# Patient Record
Sex: Female | Born: 1935
Health system: Southern US, Community
[De-identification: ages and names within clinical notes are randomized; demographics above are authoritative.]

## PROBLEM LIST (undated history)

## (undated) DIAGNOSIS — E785 Hyperlipidemia, unspecified: Secondary | ICD-10-CM

## (undated) DIAGNOSIS — I10 Essential (primary) hypertension: Secondary | ICD-10-CM

## (undated) DIAGNOSIS — I219 Acute myocardial infarction, unspecified: Secondary | ICD-10-CM

## (undated) HISTORY — DX: Hyperlipidemia, unspecified: E78.5

## (undated) HISTORY — DX: Acute myocardial infarction, unspecified: I21.9

## (undated) HISTORY — DX: Essential (primary) hypertension: I10

---

## 2008-04-04 ENCOUNTER — Emergency Department (HOSPITAL_COMMUNITY): Admission: EM | Admit: 2008-04-04 | Discharge: 2008-04-05 | Payer: Self-pay | Admitting: Emergency Medicine

## 2011-08-08 LAB — DIFFERENTIAL
Basophils Relative: 0
Lymphs Abs: 2.7
Monocytes Absolute: 0.6
Monocytes Relative: 6
Neutro Abs: 5.9

## 2011-08-08 LAB — POCT I-STAT, CHEM 8
BUN: 30 — ABNORMAL HIGH
Chloride: 108
Creatinine, Ser: 1.5 — ABNORMAL HIGH
Potassium: 3.8
Sodium: 138
TCO2: 21

## 2011-08-08 LAB — CBC
Hemoglobin: 13.6
MCHC: 34.7
MCV: 81
RBC: 4.86
WBC: 9.6

## 2011-08-08 LAB — POCT CARDIAC MARKERS
CKMB, poc: <1 — ABNORMAL LOW
Myoglobin, poc: 47.8
Operator id: 277751
Troponin i, poc: <0.05

## 2013-12-23 ENCOUNTER — Ambulatory Visit: Payer: Medicare Other

## 2013-12-23 ENCOUNTER — Ambulatory Visit (INDEPENDENT_AMBULATORY_CARE_PROVIDER_SITE_OTHER): Payer: Medicare Other | Admitting: Emergency Medicine

## 2013-12-23 VITALS — BP 102/60 | HR 68 | Temp 98.2°F | Resp 16 | Ht 63.0 in | Wt 173.4 lb

## 2013-12-23 DIAGNOSIS — M25519 Pain in unspecified shoulder: Secondary | ICD-10-CM | POA: Diagnosis not present

## 2013-12-23 MED ORDER — DICLOFENAC SODIUM 1 % TD GEL: 2.0000 g | Freq: Four times a day (QID) | TRANSDERMAL | Status: DC

## 2013-12-23 NOTE — Patient Instructions (Addendum)
     You have an appointment at the [address] to see Dr. Everlene Farrier at 8:00 in the morning on Monday, February 23  Shoulder Pain The shoulder is the joint that connects your arms to your body. The bones that form the shoulder joint include the upper arm bone (humerus), the shoulder blade (scapula), and the collarbone (clavicle). The top of the humerus is shaped like a ball and fits into a rather flat socket on the scapula (glenoid cavity). A combination of muscles and strong, fibrous tissues that connect muscles to bones (tendons) support your shoulder joint and hold the ball in the socket. Small, fluid-filled sacs (bursae) are located in different areas of the joint. They act as cushions between the bones and the overlying soft tissues and help reduce friction between the gliding tendons and the bone as you move your arm. Your shoulder joint allows a wide range of motion in your arm. This range of motion allows you to do things like scratch your back or throw a ball. However, this range of motion also makes your shoulder more prone to pain from overuse and injury. Causes of shoulder pain can originate from both injury and overuse and usually can be grouped in the following four categories:  Redness, swelling, and pain (inflammation) of the tendon (tendinitis) or the bursae (bursitis).  Instability, such as a dislocation of the joint.  Inflammation of the joint (arthritis).  Broken bone (fracture). HOME CARE INSTRUCTIONS   Apply ice to the sore area.  Put ice in a plastic bag.  Place a towel between your skin and the bag.  Leave the ice on for 15-20 minutes, 03-04 times per day for the first 2 days.  Stop using cold packs if they do not help with the pain.  If you have a shoulder sling or immobilizer, wear it as long as your caregiver instructs. Only remove it to shower or bathe. Move your arm as little as possible, but keep your hand moving to prevent swelling.  Squeeze a soft ball or foam  pad as much as possible to help prevent swelling.  Only take over-the-counter or prescription medicines for pain, discomfort, or fever as directed by your caregiver. SEEK MEDICAL CARE IF:   Your shoulder pain increases, or new pain develops in your arm, hand, or fingers.  Your hand or fingers become cold and numb.  Your pain is not relieved with medicines. SEEK IMMEDIATE MEDICAL CARE IF:   Your arm, hand, or fingers are numb or tingling.  Your arm, hand, or fingers are significantly swollen or turn white or blue. MAKE SURE YOU:   Understand these instructions.  Will watch your condition.  Will get help right away if you are not doing well or get worse. Document Released: 08/08/2005 Document Revised: 07/23/2012 Document Reviewed: 10/13/2011 [facility] Patient Information 2014 [employer].

## 2013-12-23 NOTE — Progress Notes (Signed)
   Subjective:    Patient ID: Ariel Kelly, female    DOB: 09-Sep-1936, 78 y.o.   MRN: [id number]  HPI patient is with severe discomfort in her right shoulder and inability to raise her right shoulder. She did help her daughter to a large amount of cooking recently and then developed the pain. She has been hurting in her right shoulder for at least a week. It is very difficult for her to raise her right arm    Review of Systems     Objective:   Physical Exam Tender over the right subdeltoid area. There is crepitation with abduction of the right arm. There is limited internal and external rotation to  [facility] reading (PRIMARY) by  Dr.  Everlene Farrier no fractures         Assessment & Plan:    Will treat with topical Voltaren and have patient schedule herself for a more complete physical examination

## 2013-12-24 ENCOUNTER — Ambulatory Visit: Payer: Medicare Other

## 2013-12-24 ENCOUNTER — Encounter: Payer: Self-pay | Admitting: Emergency Medicine

## 2013-12-24 ENCOUNTER — Ambulatory Visit (INDEPENDENT_AMBULATORY_CARE_PROVIDER_SITE_OTHER): Payer: Medicare Other | Admitting: Emergency Medicine

## 2013-12-24 ENCOUNTER — Other Ambulatory Visit: Payer: Self-pay | Admitting: Emergency Medicine

## 2013-12-24 VITALS — BP 130/68 | HR 69 | Temp 98.6°F | Resp 16 | Ht 63.5 in | Wt 172.2 lb

## 2013-12-24 DIAGNOSIS — Z720 Tobacco use: Secondary | ICD-10-CM

## 2013-12-24 DIAGNOSIS — Z1211 Encounter for screening for malignant neoplasm of colon: Secondary | ICD-10-CM | POA: Diagnosis not present

## 2013-12-24 DIAGNOSIS — F17200 Nicotine dependence, unspecified, uncomplicated: Secondary | ICD-10-CM

## 2013-12-24 DIAGNOSIS — R112 Nausea with vomiting, unspecified: Secondary | ICD-10-CM | POA: Diagnosis not present

## 2013-12-24 DIAGNOSIS — H8110 Benign paroxysmal vertigo, unspecified ear: Secondary | ICD-10-CM

## 2013-12-24 DIAGNOSIS — Z0000 Encounter for general adult medical examination without abnormal findings: Secondary | ICD-10-CM

## 2013-12-24 DIAGNOSIS — R1013 Epigastric pain: Secondary | ICD-10-CM | POA: Diagnosis not present

## 2013-12-24 DIAGNOSIS — I10 Essential (primary) hypertension: Secondary | ICD-10-CM | POA: Diagnosis not present

## 2013-12-24 DIAGNOSIS — D2339 Other benign neoplasm of skin of other parts of face: Secondary | ICD-10-CM

## 2013-12-24 LAB — POCT CBC
Granulocyte percent: 65.2 %G (ref 37–80)
HCT, POC: 40.4 % (ref 37.7–47.9)
Hemoglobin: 12.4 g/dL (ref 12.2–16.2)
Lymph, poc: 2.1 (ref 0.6–3.4)
MCH: 26.7 pg — AB (ref 27–31.2)
MCHC: 30.7 g/dL — AB (ref 31.8–35.4)
MCV: 87.1 fL (ref 80–97)
MID (CBC): 0.5 (ref 0–0.9)
MPV: 12.4 fL (ref 0–99.8)
PLATELET COUNT, POC: 157 10*3/uL (ref 142–424)
POC Granulocyte: 4.8 (ref 2–6.9)
POC LYMPH PERCENT: 28.7 %L (ref 10–50)
POC MID %: 6.1 %M (ref 0–12)
RBC: 4.64 M/uL (ref 4.04–5.48)
RDW, POC: 13.9 %
WBC: 7.4 10*3/uL (ref 4.6–10.2)

## 2013-12-24 LAB — COMPLETE METABOLIC PANEL WITH GFR
ALT: 14 U/L (ref 0–35)
AST: 14 U/L (ref 0–37)
Albumin: 4.1 g/dL (ref 3.5–5.2)
Alkaline Phosphatase: 45 U/L (ref 39–117)
BUN: 29 mg/dL — AB (ref 6–23)
CALCIUM: 9.8 mg/dL (ref 8.4–10.5)
CHLORIDE: 105 meq/L (ref 96–112)
CO2: 25 meq/L (ref 19–32)
CREATININE: 1.34 mg/dL — AB (ref 0.50–1.10)
GFR, EST AFRICAN AMERICAN: 45 mL/min — AB
GFR, EST NON AFRICAN AMERICAN: 39 mL/min — AB
GLUCOSE: 85 mg/dL (ref 70–99)
Potassium: 4.9 mEq/L (ref 3.5–5.3)
Sodium: 138 mEq/L (ref 135–145)
Total Bilirubin: 0.4 mg/dL (ref 0.2–1.2)
Total Protein: 7.3 g/dL (ref 6.0–8.3)

## 2013-12-24 LAB — POCT URINALYSIS DIPSTICK
BILIRUBIN UA: NEGATIVE
Blood, UA: NEGATIVE
Glucose, UA: NEGATIVE
KETONES UA: NEGATIVE
Nitrite, UA: NEGATIVE
PROTEIN UA: NEGATIVE
Spec Grav, UA: 1.02
Urobilinogen, UA: 0.2
pH, UA: 5

## 2013-12-24 LAB — IFOBT (OCCULT BLOOD): IFOBT: NEGATIVE

## 2013-12-24 LAB — LIPID PANEL
CHOLESTEROL: 224 mg/dL — AB (ref 0–200)
HDL: 54 mg/dL (ref >39)
LDL Cholesterol: 131 mg/dL — ABNORMAL HIGH (ref 0–99)
Total CHOL/HDL Ratio: 4.1 Ratio
Triglycerides: 195 mg/dL — ABNORMAL HIGH (ref <150)
VLDL: 39 mg/dL (ref 0–40)

## 2013-12-24 MED ORDER — OMEPRAZOLE 40 MG PO CPDR: 40.0000 mg | DELAYED_RELEASE_CAPSULE | Freq: Every day | ORAL | Status: DC

## 2013-12-24 MED ORDER — HYDROCHLOROTHIAZIDE 12.5 MG PO TABS: 12.5000 mg | ORAL_TABLET | Freq: Every day | ORAL | Status: DC

## 2013-12-24 NOTE — Progress Notes (Addendum)
@[email or web address] @  Patient ID: Ariel Kelly MRN: [id number], DOB: July 08, 1936, 78 y.o. Date of Encounter: 12/24/2013, 11:22 AM  Primary Physician: No primary provider on file.  Chief Complaint: Physical (CPE)  HPI: 78 y.o. y/o female with history of noted below here for CPE.  Doing well. No issues/complaints.  No LMP recorded. Patient is postmenopausal. Pap:  MMG Review of Systems:  Consitutional: No fever, chills, fatigue, night sweats, lymphadenopathy, or weight changes. Eyes: No visual changes, eye redness, or discharge. ENT/Mouth: Ears: No otalgia, tinnitus, hearing loss, discharge. Nose: No congestion, rhinorrhea, sinus pain, or epistaxis. Throat: No sore throat, post nasal drip, or teeth pain. Cardiovascular: No CP, palpitations, diaphoresis, DOE, edema, orthopnea, PND. Respiratory: No shortness of breath, no wheezing, no cough. Gastrointestinal: She has chronic midepigastric abdominal pain. She has a history of a GI bleed. She takes tums  on a regular basis.She is on prilosec Breast: No discharge, pain, swelling, or mass. Genitourinary: No dysuria, frequency, urgency, hematuria, incontinence, nocturia, amenorrhea, vaginal discharge, pruritis, burning, abnormal bleeding, or pain. Musculoskeletal: No decreased ROM, myalgias, stiffness, joint swelling, or weakness. Skin: No rash, erythema, lesion changes, pain, warmth, jaundice, or pruritis. She has a lesion on the towhead she would like removed. Neurological: No headache, dizziness, syncope, seizures, tremors, memory loss, coordination problems, or paresthesias. She has occasional dizziness Psychological: No anxiety, depression, hallucinations, SI/HI. Endocrine: No fatigue, polydipsia, polyphagia, polyuria, or known diabetes. All other systems were reviewed and are otherwise negative.  Past Medical History  Diagnosis Date  . Hypertension   . Myocardial infarction      No past surgical history on file.  Home Meds:  Prior to  Admission medications   Medication Sig Start Date End Date Taking? Authorizing Provider  diclofenac sodium (VOLTAREN) 1 % GEL Apply 2 g topically 4 (four) times daily. 12/23/13  Yes [name], MD  enalapril (VASOTEC) 10 MG tablet Take 10 mg by mouth daily.   Yes Historical Provider, MD  hydrochlorothiazide (HYDRODIURIL) 12.5 MG tablet Take 12.5 mg by mouth daily.   Yes Historical Provider, MD  omeprazole (PRILOSEC) 20 MG capsule Take 20 mg by mouth daily.   Yes Historical Provider, MD    Allergies:  Allergies  Allergen Reactions  . Asa [Aspirin] Other (See Comments)    GI UPSET    History   Social History  . Marital Status: Widowed    Spouse Name: N/A    Number of Children: N/A  . Years of Education: N/A   Occupational History  . Not on file.   Social History Main Topics  . Smoking status: Current Every Day Smoker  . Smokeless tobacco: Not on file  . Alcohol Use: No  . Drug Use: No  . Sexual Activity: Not on file   Other Topics Concern  . Not on file   Social History Narrative  . No narrative on file    Family History  Problem Relation Age of Onset  . Heart disease Mother   . Cancer Father     Physical Exam  Blood pressure 130/68, pulse 69, temperature 98.6 F (37 C), temperature source Oral, resp. rate 16, height 5' 3.5" (1.613 m), weight 172 lb 3.2 oz (78.109 kg), SpO2 97.00%., Body mass index is 30.02 kg/(m^2). General: Well developed, well nourished, in no acute distress. There is a 2 x 2 centimeters cystic area midforehead HEENT: Normocephalic, atraumatic. Conjunctiva pink, sclera non-icteric. Pupils 2 mm constricting to 1 mm, round, regular, and equally reactive to light and accomodation. EOMI.  Internal auditory canal clear. TMs with good cone of light and without pathology. Nasal mucosa pink. Nares are without discharge. No sinus tenderness. Oral mucosa pink. Dentition. Pharynx without exudate.   Neck: Supple. Trachea midline. No thyromegaly. Full ROM. No  lymphadenopathy. Lungs: Clear to auscultation bilaterally without wheezes, rales, or rhonchi. Breathing is of normal effort and unlabored. Cardiovascular: RRR with S1 S2. No murmurs, rubs, or gallops appreciated. Distal pulses 2+ symmetrically. No carotid or abdominal bruits. Breast: No masses are felt. Abdomen: Soft, non-tender, non-distended with normoactive bowel sounds. No hepatosplenomegaly or masses. No rebound/guarding. No CVA tenderness. Without hernias. There is significant midepigastric tenderness. No rebound . Genitourinary: Deferred  Musculoskeletal: Full range of motion and 5/5 strength throughout. Without swelling, atrophy, tenderness, crepitus, or warmth. Extremities without clubbing, cyanosis, or edema. Calves supple. There is decreased range of motion of the right shoulder with pain on abduction . Skin: Warm and moist without erythema, ecchymosis, wounds, or rash. There is stasis changes with air-contrast views of the left leg. Pulses are 2+ and symmetrical and there are mild to moderate varicosities noted to Neuro: A+Ox3. CN II-XII grossly intact. Moves all extremities spontaneously. Full sensation throughout. Normal gait. DTR 2+ throughout upper and lower extremities. Finger to nose intact. Psych:  Responds to questions appropriately with a normal affect.she does not speak the language but her affect is appropriate    [facility] reading (PRIMARY) by  Dr. Everlene Farrier there is epicardial fat present on the PA view please comment no mass is seen EKG normal sinus rhythm Results for orders placed in visit on 12/24/13  POCT URINALYSIS DIPSTICK      Result Value Ref Range   Color, UA yellow     Clarity, UA clear     Glucose, UA neg     Bilirubin, UA neg     Ketones, UA neg     Spec Grav, UA 1.020     Blood, UA neg     pH, UA 5.0     Protein, UA neg     Urobilinogen, UA 0.2     Nitrite, UA neg     Leukocytes, UA Trace         Assessment/Plan:  78 y.o. y/o female here for CPE. I  suspect she may have underlying ulcer disease. We'll have her on Prilosec 40 mg one a day. She will take the HCTZ alone for blood pressure control. She will be scheduled for a mammogram. GI consult was  ordered along with dermatology consult . I have put her Voltaren gel on hold even  though she had this abdominal pain for years prior to starting it. She declined a flu shot. I do not have any previous immunization records. I will try and get records from her doctor in [place]. We'll recheck in one month. Hopefully at that time that dermatology consult and GI consult will have been completed .  -  Signed, Nena Jordan, MD 12/24/2013 11:22 AM

## 2013-12-24 NOTE — Progress Notes (Signed)
   Subjective:    Patient ID: Ariel Kelly, female    DOB: 05/20/1936, 77 y.o.   MRN: [id number]  HPI    Review of Systems     Objective:   Physical Exam        Assessment & Plan:   

## 2013-12-24 NOTE — Progress Notes (Deleted)
   Subjective:    Patient ID: Ariel Kelly, female    DOB: 07-06-1936, 78 y.o.   MRN: [id number]  HPI    Review of Systems     Objective:   Physical Exam        Assessment & Plan:

## 2013-12-24 NOTE — Patient Instructions (Addendum)
Please stop the anti-inflammatory gel diclofenac. Go to the store and get a cream called arnica and apply four times a day

## 2013-12-25 LAB — LIPASE: LIPASE: 38 U/L (ref 0–75)

## 2013-12-25 LAB — AMYLASE: Amylase: 68 U/L (ref 0–105)

## 2013-12-25 LAB — H. PYLORI ANTIBODY, IGG: H Pylori IgG: 3.95 {ISR} — ABNORMAL HIGH

## 2013-12-25 LAB — TSH: TSH: 1.217 u[IU]/mL (ref 0.350–4.500)

## 2013-12-28 ENCOUNTER — Other Ambulatory Visit: Payer: Self-pay | Admitting: *Deleted

## 2013-12-28 ENCOUNTER — Telehealth: Payer: Self-pay

## 2013-12-28 ENCOUNTER — Encounter: Payer: Self-pay | Admitting: Emergency Medicine

## 2013-12-28 MED ORDER — AMOXICILLIN 500 MG PO CAPS: 1000.0000 mg | ORAL_CAPSULE | Freq: Two times a day (BID) | ORAL | Status: DC

## 2013-12-28 MED ORDER — CLARITHROMYCIN 500 MG PO TABS: 500.0000 mg | ORAL_TABLET | Freq: Two times a day (BID) | ORAL | Status: DC

## 2013-12-28 MED ORDER — LANSOPRAZOLE 30 MG PO CPDR: 30.0000 mg | DELAYED_RELEASE_CAPSULE | Freq: Two times a day (BID) | ORAL | Status: DC

## 2013-12-28 NOTE — Telephone Encounter (Signed)
PT STATES THE LAB HAD CALLED REGARDING LAB RESULTS. [place] [phone]

## 2013-12-28 NOTE — Telephone Encounter (Signed)
Pt's daughter notified with results, and medication called into pharmacy.

## 2013-12-31 ENCOUNTER — Encounter: Payer: Self-pay | Admitting: Nurse Practitioner

## 2014-01-04 ENCOUNTER — Encounter: Payer: Medicare Other | Admitting: Emergency Medicine

## 2014-01-08 ENCOUNTER — Ambulatory Visit (INDEPENDENT_AMBULATORY_CARE_PROVIDER_SITE_OTHER): Payer: Medicare Other | Admitting: Nurse Practitioner

## 2014-01-08 ENCOUNTER — Encounter: Payer: Self-pay | Admitting: Nurse Practitioner

## 2014-01-08 VITALS — BP 120/80 | HR 76 | Ht 62.0 in | Wt 174.0 lb

## 2014-01-08 DIAGNOSIS — R1013 Epigastric pain: Secondary | ICD-10-CM | POA: Diagnosis not present

## 2014-01-08 MED ORDER — OMEPRAZOLE 40 MG PO CPDR: 20.0000 mg | DELAYED_RELEASE_CAPSULE | Freq: Every day | ORAL | Status: DC

## 2014-01-08 NOTE — Progress Notes (Signed)
    HPI :  Patient is a 78 year old non-English speaking female from [place] here with her daughter and a Optometrist. Patient referred by PCP for evaluation of epigastric pain. She gives a 25 year history of epigastric pain with nausea and vomiting.. In [place] she took Tagamet for years. [place] has been on daily Omeprazole for 5 years but sometimes has to take it twice dailly depending on what she eats. Patient recently tested positive for h.pylori IgG and was treated with course of antibiotics. Her epigastric pain and nausea have since completely resolved. She denies any other chronic GI problems. Weight is stable.   Past Medical History  Diagnosis Date  . Hypertension   . Myocardial infarction     Family History  Problem Relation Age of Onset  . Heart disease Mother   . Cancer Father    History  Substance Use Topics  . Smoking status: Current Every Day Smoker  . Smokeless tobacco: Not on file  . Alcohol Use: No   Current Outpatient Prescriptions  Medication Sig Dispense Refill  . clarithromycin (BIAXIN) 500 MG tablet Take 1 tablet (500 mg total) by mouth 2 (two) times daily.  20 tablet  0  . diclofenac sodium (VOLTAREN) 1 % GEL Apply 2 g topically 4 (four) times daily.  100 g  1  . enalapril (VASOTEC) 10 MG tablet Take 10 mg by mouth daily.      . hydrochlorothiazide (HYDRODIURIL) 12.5 MG tablet Take 1 tablet (12.5 mg total) by mouth daily.  30 tablet  11  . omeprazole (PRILOSEC) 40 MG capsule Take 20 mg by mouth 2 (two) times daily before a meal.       No current facility-administered medications for this visit.   Allergies  Allergen Reactions  . Asa [Aspirin] Other (See Comments)    GI UPSET   Review of Systems: All systems reviewed and negative except where noted in HPI.   Physical Exam: BP 120/80  Pulse 76  Ht 5\' 2"  (1.575 m)  Wt 174 lb (78.926 kg)  BMI 31.82 kg/m2 Constitutional: Pleasant,well-developed,  female in no acute distress. HEENT: Normocephalic  and atraumatic. Conjunctivae are normal. No scleral icterus. Neck supple.  Cardiovascular: Normal rate, regular rhythm.  Pulmonary/chest: Effort normal and breath sounds normal. No wheezing, rales or rhonchi. Abdominal: Soft, nondistended, nontender. Bowel sounds active throughout. There are no masses palpable. No hepatomegaly. Extremities: no edema Lymphadenopathy: No cervical adenopathy noted. Neurological: Alert and oriented to person place and time. Skin: Skin is warm and dry. No rashes noted. Psychiatric: Normal mood and affect. Behavior is normal.   ASSESSMENT AND PLAN: 78 year old female with a 25 year history of nausea, vomiting and epigastric pain only partially responsive to PPI. Recently completed treatment for H.pylori and her symptoms have since resolved. She is taking Omeprazole 40mg  1 to 2 times daily. This may be non-ulcer dyspepsia or perhaps PUD but she is currently asymptomatic and doesn't want to undergo EGD.  I explained that other underlying etiologies are possible as well. At this point she would like to continue Omeprazole 40mg  1 to 2 times daily and and undergo workup only for recurrent symptoms. This is reasonable given advanced age and absence of alarm features.

## 2014-01-08 NOTE — Patient Instructions (Signed)
We have sent the following medications to your pharmacy for you to pick up at your convenience: Omeprazole 40 mg daily.   Call us if your pain in recurrent.

## 2014-01-11 NOTE — Progress Notes (Signed)
i agree with the plan outlined in this note 

## 2014-01-26 ENCOUNTER — Ambulatory Visit (INDEPENDENT_AMBULATORY_CARE_PROVIDER_SITE_OTHER): Payer: Medicare Other | Admitting: Emergency Medicine

## 2014-01-26 ENCOUNTER — Encounter: Payer: Self-pay | Admitting: Emergency Medicine

## 2014-01-26 VITALS — BP 130/62 | HR 75 | Temp 98.2°F | Resp 16 | Wt 174.0 lb

## 2014-01-26 DIAGNOSIS — K279 Peptic ulcer, site unspecified, unspecified as acute or chronic, without hemorrhage or perforation: Secondary | ICD-10-CM | POA: Diagnosis not present

## 2014-01-26 DIAGNOSIS — A048 Other specified bacterial intestinal infections: Secondary | ICD-10-CM | POA: Diagnosis not present

## 2014-01-26 DIAGNOSIS — M25519 Pain in unspecified shoulder: Secondary | ICD-10-CM

## 2014-01-26 DIAGNOSIS — B9681 Helicobacter pylori [H. pylori] as the cause of diseases classified elsewhere: Secondary | ICD-10-CM

## 2014-01-26 MED ORDER — OMEPRAZOLE 40 MG PO CPDR: 40.0000 mg | DELAYED_RELEASE_CAPSULE | Freq: Every day | ORAL | Status: DC

## 2014-01-26 NOTE — Progress Notes (Addendum)
  This chart was scribed for Ariel Russian, MD by Ariel Kelly, ED Scribe. This patient was seen in room Room/bed 23 and the patient's care was started at 11:29 AM. Subjective:    Patient ID: Ariel Kelly, female    DOB: [birth year], 78 y.o.   MRN: [id number]  HPI [name] is a 78 y.o. female who presents to the [facility] for H/pylori F/U apt. Pt was referred to be seen by GI provider 1 month ago and reports sx have been relieved. She states she is feeling better and reports being improved with medication. Pt states she is still taking Meprozol. Pt was frequently eating lamb when she lived in [place]. Pt will be returning to [place] in 2 months to visit her daughter and friends. She was also treated with Voltaren gel for significant right shoulder pain. This is significantly better Review of Systems  Cardiovascular: Negative for chest pain.  Gastrointestinal: Negative for vomiting, abdominal pain and diarrhea.   Objective:   Physical Exam CONSTITUTIONAL: Well developed/well nourished HEAD: Normocephalic/atraumatic EYES: EOMI/PERRL ENMT: Mucous membranes moist NECK: supple no meningeal signs SPINE:entire spine nontender CV: S1/S2 noted, no murmurs/rubs/gallops noted LUNGS: Lungs are clear to auscultation bilaterally, no apparent distress ABDOMEN: soft, nontender, no rebound or guarding GU:no cva tenderness NEURO: Pt is awake/alert, moves all extremitiesx4 EXTREMITIES: pulses normal, full ROM she does have trace edema with mild to moderate varicose veins SKIN: warm, color normal PSYCH: no abnormalities of mood noted  Triage Vitals:BP 130/62  Pulse 75  Temp(Src) 98.2 F (36.8 C)  Resp 16  Wt 174 lb (78.926 kg)  SpO2 98% Assessment & Plan:   Prescription written for moderate compression surgical support stockings. She was also given the name and number of [facility]. Her PPI was refilled the shoulder pain is much improved with the Voltaren gel  I  personally performed the services described in this documentation, which was scribed in my presence. The recorded information has been reviewed and is accurate.

## 2014-01-26 NOTE — Progress Notes (Signed)
   Subjective:    Patient ID: Ariel Kelly, female    DOB: 03/15/1936, 77 y.o.   MRN: [id number]  HPI    Review of Systems     Objective:   Physical Exam        Assessment & Plan:   

## 2014-02-09 ENCOUNTER — Ambulatory Visit: Payer: Medicare Other | Admitting: Emergency Medicine

## 2014-03-15 ENCOUNTER — Ambulatory Visit: Payer: Medicare Other

## 2014-03-15 ENCOUNTER — Ambulatory Visit (INDEPENDENT_AMBULATORY_CARE_PROVIDER_SITE_OTHER): Payer: Medicare Other | Admitting: Emergency Medicine

## 2014-03-15 VITALS — BP 122/68 | HR 63 | Temp 97.8°F | Resp 14 | Ht 62.3 in | Wt 172.0 lb

## 2014-03-15 DIAGNOSIS — I10 Essential (primary) hypertension: Secondary | ICD-10-CM | POA: Diagnosis not present

## 2014-03-15 DIAGNOSIS — M25562 Pain in left knee: Secondary | ICD-10-CM

## 2014-03-15 DIAGNOSIS — M25569 Pain in unspecified knee: Secondary | ICD-10-CM | POA: Diagnosis not present

## 2014-03-15 MED ORDER — HYDROCHLOROTHIAZIDE 12.5 MG PO TABS: 12.5000 mg | ORAL_TABLET | Freq: Every day | ORAL | Status: DC

## 2014-03-15 MED ORDER — ENALAPRIL MALEATE 10 MG PO TABS: 10.0000 mg | ORAL_TABLET | Freq: Every day | ORAL | Status: DC

## 2014-03-15 MED ORDER — OMEPRAZOLE 40 MG PO CPDR: 40.0000 mg | DELAYED_RELEASE_CAPSULE | Freq: Every day | ORAL | Status: DC

## 2014-03-15 NOTE — Progress Notes (Signed)
   Subjective:    Patient ID: Ariel Kelly, female    DOB: June 15, 1936, 78 y.o.   MRN: [id number]  HPI 78 y.o. Female presents to clinic for medication refills. Has questions concerning medications. Will be traveling to [place] and [place] for approx. 4-5 months on 12th of May. Daughter reports that patient has been taking Hydrodiuril twice daily. Reports that she is feeling better.  Patient reports having some discomfort in the left knee.    Review of Systems     Objective:   Physical Exam patient is alert and cooperative. Blood pressure is 120/60. Neck is supple to chest clear to auscultation and percussion heart rate no murmurs. Extremities without edema   exam of the left knee reveals significant medial joint line tend [facility] reading (PRIMARY) by  Dr.Petula Rotolo patient has significant chondrocalcinosis with degenerative changes      Assessment & Plan:  Her meds were refilled for 6 months. I made a referral to orthopedics to see about getting her knee injected prior to her trip.

## 2014-03-16 LAB — BASIC METABOLIC PANEL
BUN: 31 mg/dL — ABNORMAL HIGH (ref 6–23)
CHLORIDE: 104 meq/L (ref 96–112)
CO2: 27 meq/L (ref 19–32)
CREATININE: 1.32 mg/dL — AB (ref 0.50–1.10)
Calcium: 10.3 mg/dL (ref 8.4–10.5)
Glucose, Bld: 98 mg/dL (ref 70–99)
POTASSIUM: 4.6 meq/L (ref 3.5–5.3)
SODIUM: 137 meq/L (ref 135–145)

## 2014-03-25 ENCOUNTER — Encounter: Payer: Self-pay | Admitting: Emergency Medicine

## 2015-11-02 DIAGNOSIS — R42 Dizziness and giddiness: Secondary | ICD-10-CM | POA: Diagnosis not present

## 2015-11-02 DIAGNOSIS — H547 Unspecified visual loss: Secondary | ICD-10-CM | POA: Diagnosis not present

## 2015-11-02 DIAGNOSIS — I10 Essential (primary) hypertension: Secondary | ICD-10-CM | POA: Diagnosis not present

## 2015-11-02 DIAGNOSIS — H903 Sensorineural hearing loss, bilateral: Secondary | ICD-10-CM | POA: Diagnosis not present

## 2015-11-02 DIAGNOSIS — F17210 Nicotine dependence, cigarettes, uncomplicated: Secondary | ICD-10-CM | POA: Diagnosis not present

## 2015-11-03 DIAGNOSIS — I951 Orthostatic hypotension: Secondary | ICD-10-CM | POA: Diagnosis not present

## 2015-11-03 DIAGNOSIS — R42 Dizziness and giddiness: Secondary | ICD-10-CM | POA: Diagnosis not present

## 2015-11-03 DIAGNOSIS — K219 Gastro-esophageal reflux disease without esophagitis: Secondary | ICD-10-CM | POA: Diagnosis not present

## 2015-11-03 DIAGNOSIS — E785 Hyperlipidemia, unspecified: Secondary | ICD-10-CM | POA: Diagnosis not present

## 2015-11-03 DIAGNOSIS — I10 Essential (primary) hypertension: Secondary | ICD-10-CM | POA: Diagnosis not present

## 2015-11-05 DIAGNOSIS — R42 Dizziness and giddiness: Secondary | ICD-10-CM | POA: Diagnosis not present

## 2015-11-24 DIAGNOSIS — R9082 White matter disease, unspecified: Secondary | ICD-10-CM | POA: Diagnosis not present

## 2015-11-24 DIAGNOSIS — H547 Unspecified visual loss: Secondary | ICD-10-CM | POA: Diagnosis not present

## 2015-11-24 DIAGNOSIS — R42 Dizziness and giddiness: Secondary | ICD-10-CM | POA: Diagnosis not present

## 2015-11-25 DIAGNOSIS — H2511 Age-related nuclear cataract, right eye: Secondary | ICD-10-CM | POA: Diagnosis not present

## 2015-11-25 DIAGNOSIS — H259 Unspecified age-related cataract: Secondary | ICD-10-CM | POA: Diagnosis not present

## 2015-11-26 IMAGING — CR DG CHEST 2V
2 series · 2 of 2 positions shown · non-contrast
Comparison: None.

CLINICAL DATA: Physical examination.

EXAM:
CHEST  2 VIEW

[PA]
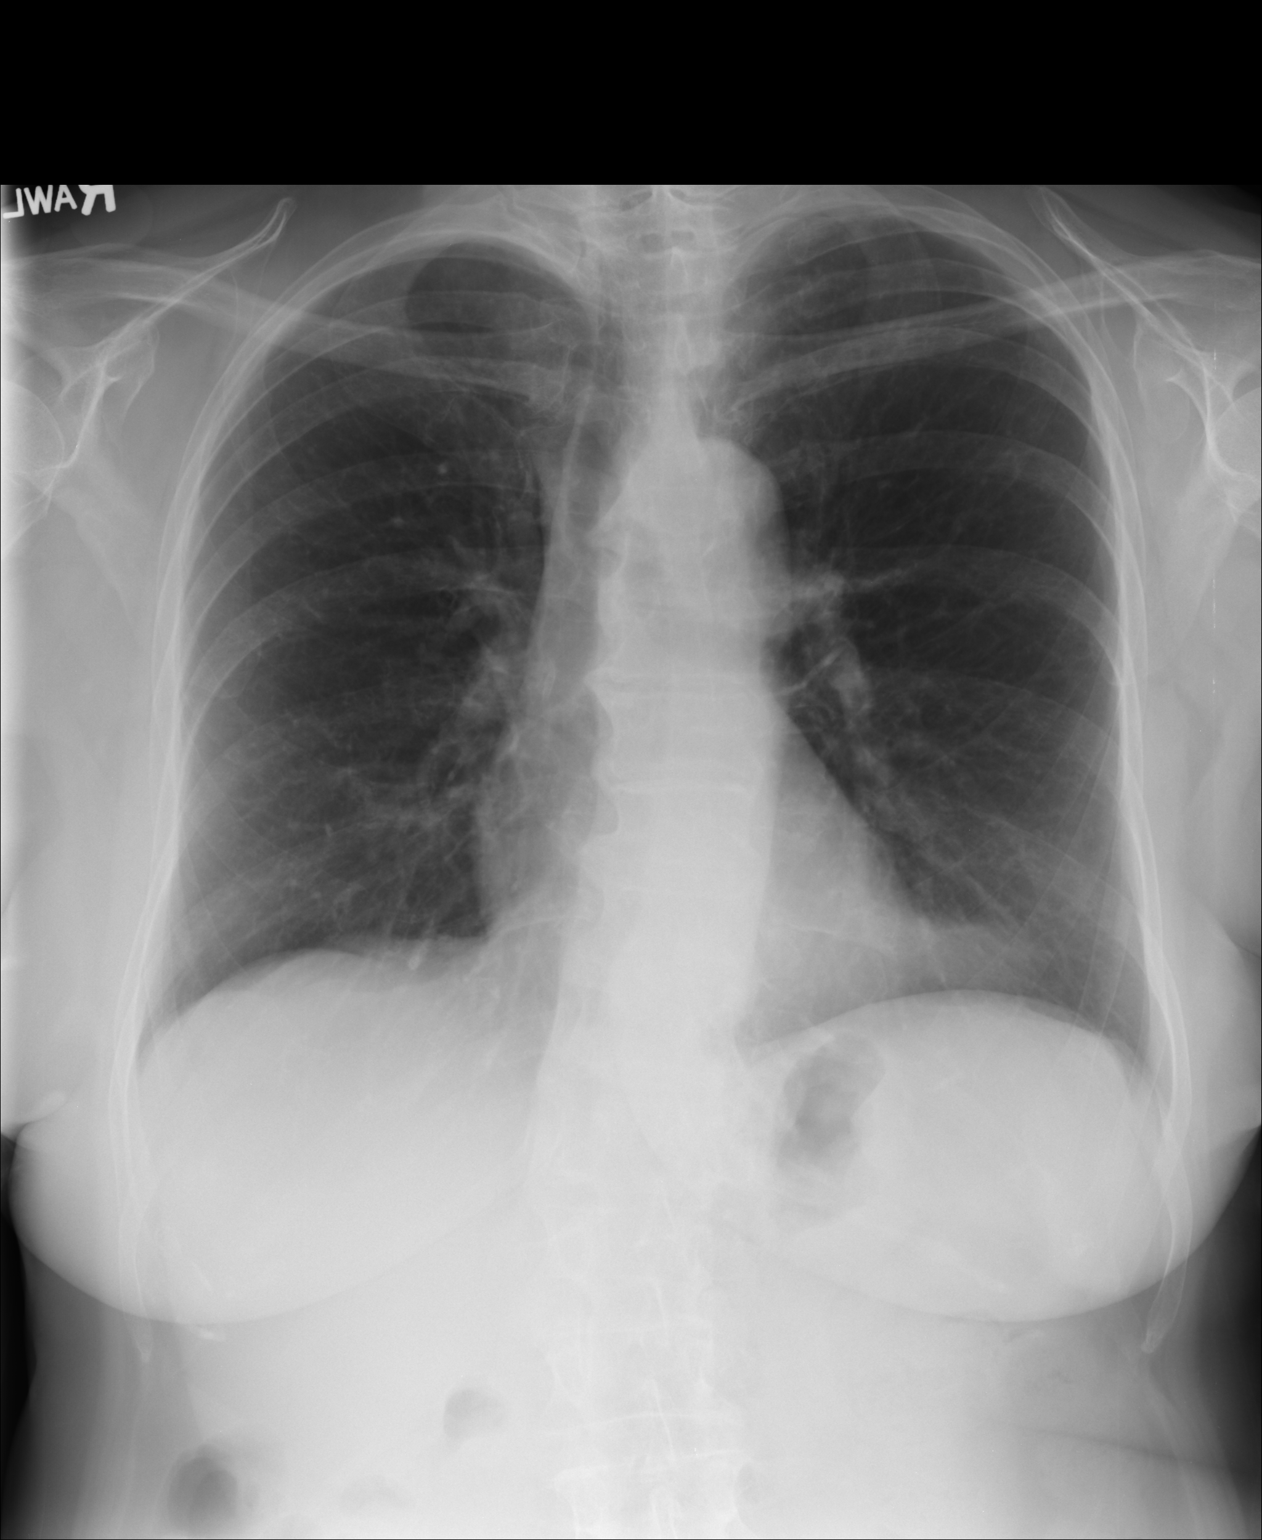

[lateral]
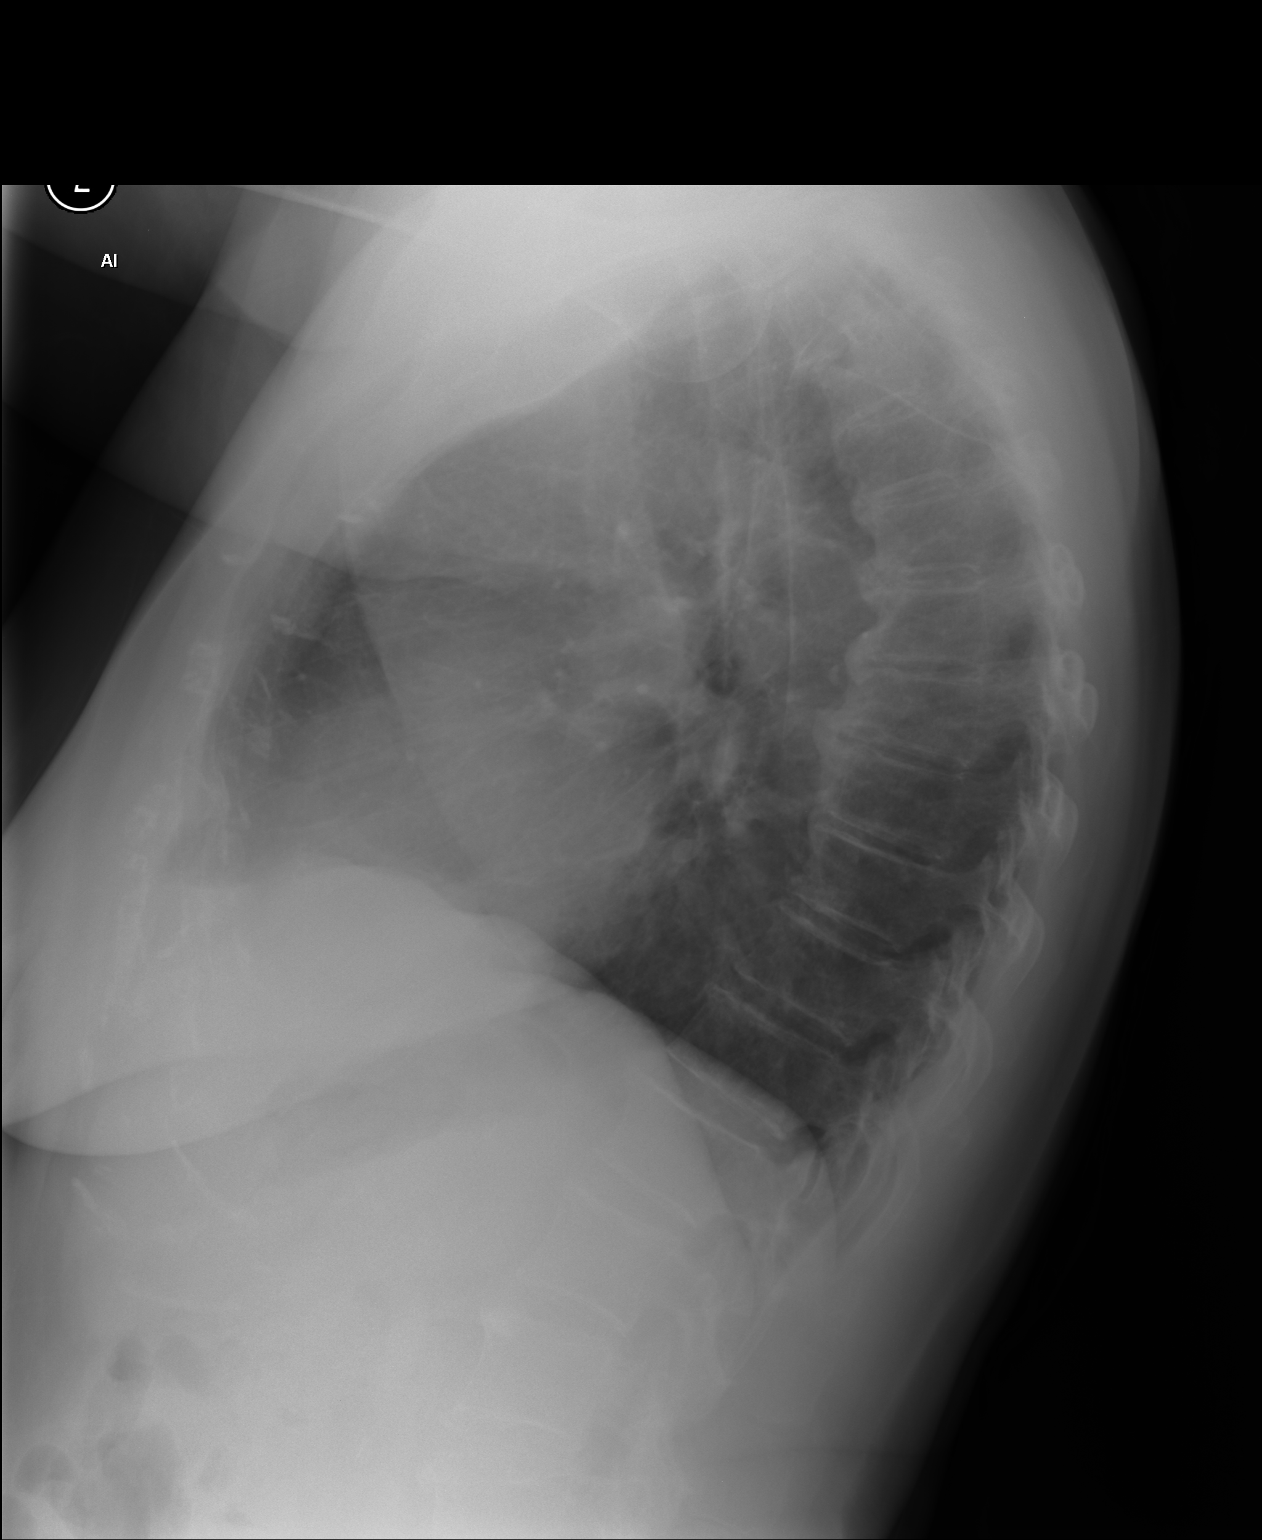

[2 of 2 positions shown; findings below may reference images not displayed]

FINDINGS: Heart size and mediastinal contours are within normal limits. Both
lungs are clear. Visualized skeletal structures are unremarkable.
IMPRESSION: Negative exam.

## 2015-12-06 DIAGNOSIS — H2512 Age-related nuclear cataract, left eye: Secondary | ICD-10-CM | POA: Diagnosis not present

## 2015-12-06 DIAGNOSIS — Z87891 Personal history of nicotine dependence: Secondary | ICD-10-CM | POA: Diagnosis not present

## 2015-12-06 DIAGNOSIS — H04123 Dry eye syndrome of bilateral lacrimal glands: Secondary | ICD-10-CM | POA: Diagnosis not present

## 2015-12-06 DIAGNOSIS — Z86718 Personal history of other venous thrombosis and embolism: Secondary | ICD-10-CM | POA: Diagnosis not present

## 2015-12-06 DIAGNOSIS — K279 Peptic ulcer, site unspecified, unspecified as acute or chronic, without hemorrhage or perforation: Secondary | ICD-10-CM | POA: Diagnosis not present

## 2015-12-06 DIAGNOSIS — I10 Essential (primary) hypertension: Secondary | ICD-10-CM | POA: Diagnosis not present

## 2015-12-06 DIAGNOSIS — K219 Gastro-esophageal reflux disease without esophagitis: Secondary | ICD-10-CM | POA: Diagnosis not present

## 2015-12-06 DIAGNOSIS — Z961 Presence of intraocular lens: Secondary | ICD-10-CM | POA: Diagnosis not present

## 2016-01-20 ENCOUNTER — Ambulatory Visit: Payer: Medicare Other

## 2016-01-26 ENCOUNTER — Ambulatory Visit (INDEPENDENT_AMBULATORY_CARE_PROVIDER_SITE_OTHER): Payer: Medicare Other | Admitting: Family Medicine

## 2016-01-26 VITALS — BP 122/70 | HR 68 | Temp 98.0°F | Resp 18 | Wt 178.8 lb

## 2016-01-26 DIAGNOSIS — I10 Essential (primary) hypertension: Secondary | ICD-10-CM

## 2016-01-26 DIAGNOSIS — K219 Gastro-esophageal reflux disease without esophagitis: Secondary | ICD-10-CM

## 2016-01-26 DIAGNOSIS — R42 Dizziness and giddiness: Secondary | ICD-10-CM

## 2016-01-26 DIAGNOSIS — H8113 Benign paroxysmal vertigo, bilateral: Secondary | ICD-10-CM

## 2016-01-26 DIAGNOSIS — E7800 Pure hypercholesterolemia, unspecified: Secondary | ICD-10-CM | POA: Diagnosis not present

## 2016-01-26 DIAGNOSIS — R0789 Other chest pain: Secondary | ICD-10-CM

## 2016-01-26 LAB — COMPREHENSIVE METABOLIC PANEL
ALT: 12 U/L (ref 6–29)
AST: 12 U/L (ref 10–35)
Albumin: 4.7 g/dL (ref 3.6–5.1)
Alkaline Phosphatase: 61 U/L (ref 33–130)
BILIRUBIN TOTAL: 0.4 mg/dL (ref 0.2–1.2)
BUN: 22 mg/dL (ref 7–25)
CO2: 25 mmol/L (ref 20–31)
CREATININE: 1.22 mg/dL — AB (ref 0.60–0.93)
Calcium: 10.2 mg/dL (ref 8.6–10.4)
Chloride: 103 mmol/L (ref 98–110)
GLUCOSE: 90 mg/dL (ref 65–99)
Potassium: 4.5 mmol/L (ref 3.5–5.3)
SODIUM: 138 mmol/L (ref 135–146)
Total Protein: 7.7 g/dL (ref 6.1–8.1)

## 2016-01-26 LAB — POCT URINALYSIS DIP (MANUAL ENTRY)
Bilirubin, UA: NEGATIVE
Glucose, UA: NEGATIVE
Ketones, POC UA: NEGATIVE
Leukocytes, UA: NEGATIVE
NITRITE UA: NEGATIVE
PH UA: 5
Protein Ur, POC: NEGATIVE
RBC UA: NEGATIVE
Spec Grav, UA: 1.015
UROBILINOGEN UA: 0.2

## 2016-01-26 LAB — POCT CBC
Granulocyte percent: 76.6 %G (ref 37–80)
HEMATOCRIT: 37.5 % — AB (ref 37.7–47.9)
Hemoglobin: 13.3 g/dL (ref 12.2–16.2)
Lymph, poc: 1.6 (ref 0.6–3.4)
MCH: 28.7 pg (ref 27–31.2)
MCHC: 35.6 g/dL — AB (ref 31.8–35.4)
MCV: 80.8 fL (ref 80–97)
MID (CBC): 0.5 (ref 0–0.9)
MPV: 10.2 fL (ref 0–99.8)
POC Granulocyte: 6.9 (ref 2–6.9)
POC LYMPH PERCENT: 17.3 %L (ref 10–50)
POC MID %: 6.1 % (ref 0–12)
Platelet Count, POC: 123 10*3/uL — AB (ref 142–424)
RBC: 4.64 M/uL (ref 4.04–5.48)
RDW, POC: 14.2 %
WBC: 9 10*3/uL (ref 4.6–10.2)

## 2016-01-26 LAB — LIPID PANEL
Cholesterol: 181 mg/dL (ref 125–200)
HDL: 74 mg/dL (ref >=46)
LDL CALC: 70 mg/dL (ref <130)
Total CHOL/HDL Ratio: 2.4 Ratio (ref <=5.0)
Triglycerides: 184 mg/dL — ABNORMAL HIGH (ref <150)
VLDL: 37 mg/dL — ABNORMAL HIGH (ref <30)

## 2016-01-26 LAB — GLUCOSE, POCT (MANUAL RESULT ENTRY): POC GLUCOSE: 101 mg/dL — AB (ref 70–99)

## 2016-01-26 NOTE — Progress Notes (Signed)
Subjective:    Patient ID: Ariel Kelly, female    DOB: Apr 09, 1936, 80 y.o.   MRN: [id number]  01/26/2016  Dizziness   HPI This 80 y.o. female presents with adult daughter who functioned as interpreter for evaluation of dizziness.   +Room spinning. In past, Took ear medication.  When lived in [place] felt dizzy; went to [place] doctor there; provided with medication.  Son is a doctor; took tablets for ears without improvement.  Prescribed ear medication for a while; stopped it.  Evaluated patient and changed medication.  Performed ultrasound; very thorough evaluation. Prescribed medication.   Recurrent dizziness three days ago.  Initial dizziness occurred in [place] less than one year ago; six months ago; initially treated with medication for ear infection; then another provider prescribed blood pressure medication. No headache; no ear pain. +rhinorrhea; +nasal congestion mild.  No sore throat.  Stopped smoking; now coughing some.  L sided chest pain intermittent; L breast pain at times; pain with rolling over in bed for the past three days. No n/t in face or extremities.  When closes eyes, dizziness worsens.  No blurred vision.  S/p cataract surgery in [place]; s/p cataract surgery in [place].  Turning head head to side causes worsening dizziness.  Dizziness does cause nausea.  Does not vomit.  No ringing in ears.  No falls.  No syncope.  Going from supine to sitting worsens/triggers dizziness; bending over triggers dizziness; looking up does not trigger dizziness.   Has lower back pain for three days.  L hand stiff and curling up; L arm pain intermittent; and in elbow and forearm.  Had constipation; took medication (?Miralax) with improvement.  Finally spoke with physician son in [place] --- patient S/p evaluation by ENT in [place] 10/2015; no ENT or neurological process; all related to HTN medications; stopped all medications for blood pressure; restarted calcium channel blockers, added a second one.   Then dizziness resolved for two months; has suffered with chronic dizziness for years.  Son with concern for dehydration or viral syndrome.  S/p MRI brain, carotid doppler.  No explanation or neurological problem. Feels more systemic or side effect of medication.  Creatinine increased while in [place].  Evaluation in past six months.     Review of Systems  Constitutional: Negative for fever, chills, diaphoresis and fatigue.  HENT: Positive for congestion and rhinorrhea. Negative for ear pain, hearing loss, sinus pressure, sneezing, sore throat, tinnitus, trouble swallowing and voice change.   Eyes: Negative for visual disturbance.  Respiratory: Negative for cough and shortness of breath.   Cardiovascular: Positive for chest pain. Negative for palpitations and leg swelling.  Gastrointestinal: Positive for abdominal pain. Negative for nausea, vomiting, diarrhea and constipation.  Endocrine: Negative for cold intolerance, heat intolerance, polydipsia, polyphagia and polyuria.  Skin: Negative for color change and rash.  Neurological: Negative for dizziness, tremors, seizures, syncope, facial asymmetry, speech difficulty, weakness, light-headedness, numbness and headaches.    Past Medical History  Diagnosis Date  . Hypertension   . Myocardial infarction [facility])    History reviewed. No pertinent past surgical history. Allergies  Allergen Reactions  . Asa [Aspirin] Other (See Comments)    GI UPSET   Current Outpatient Prescriptions  Medication Sig Dispense Refill  . amLODipine (NORVASC) 2.5 MG tablet Take 2.5 mg by mouth daily.    . candesartan (ATACAND) 8 MG tablet Take 8 mg by mouth daily.    Marland Kitchen omeprazole (PRILOSEC) 40 MG capsule Take 1 capsule (40 mg total) by mouth  daily. 180 capsule 1  . simvastatin (ZOCOR) 10 MG tablet Take 10 mg by mouth daily.     No current facility-administered medications for this visit.   Social History   Social History  . Marital Status: Widowed    Spouse  Name: N/A  . Number of Children: N/A  . Years of Education: N/A   Occupational History  . Not on file.   Social History Main Topics  . Smoking status: Current Every Day Smoker  . Smokeless tobacco: Not on file  . Alcohol Use: No  . Drug Use: No  . Sexual Activity: Not on file   Other Topics Concern  . Not on file   Social History Narrative   Family History  Problem Relation Age of Onset  . Heart disease Mother   . Cancer Father        Objective:    BP 122/70 mmHg  Pulse 68  Temp(Src) 98 F (36.7 C) (Oral)  Resp 18  Wt 178 lb 12.8 oz (81.103 kg)  SpO2 99% Physical Exam  Constitutional: She is oriented to person, place, and time. She appears well-developed and well-nourished. No distress.  HENT:  Head: Normocephalic and atraumatic.  Right Ear: External ear normal.  Left Ear: External ear normal.  Nose: Nose normal.  Mouth/Throat: Oropharynx is clear and moist.  Eyes: Conjunctivae and EOM are normal. Pupils are equal, round, and reactive to light.  Neck: Normal range of motion. Neck supple. Carotid bruit is not present. No thyromegaly present.  Cardiovascular: Normal rate, regular rhythm, normal heart sounds and intact distal pulses.  Exam reveals no gallop and no friction rub.   No murmur heard. Pulmonary/Chest: Effort normal and breath sounds normal. She has no wheezes. She has no rales.  Abdominal: Soft. Bowel sounds are normal. She exhibits no distension and no mass. There is no hepatosplenomegaly. There is tenderness in the epigastric area. There is no rebound, no guarding and no CVA tenderness. No hernia.  Lymphadenopathy:    She has no cervical adenopathy.  Neurological: She is alert and oriented to person, place, and time. She has normal strength. No cranial nerve deficit or sensory deficit. She exhibits normal muscle tone. She displays a negative Romberg sign. Coordination normal.  Skin: Skin is warm and dry. No rash noted. She is not diaphoretic. No  erythema. No pallor.  Psychiatric: She has a normal mood and affect. Her behavior is normal. Judgment and thought content normal.   Results for orders placed or performed in visit on 01/26/16  POCT CBC  Result Value Ref Range   WBC 9.0 4.6 - 10.2 K/uL   Lymph, poc 1.6 0.6 - 3.4   POC LYMPH PERCENT 17.3 10 - 50 %L   MID (cbc) 0.5 0 - 0.9   POC MID % 6.1 0 - 12 %M   POC Granulocyte 6.9 2 - 6.9   Granulocyte percent 76.6 37 - 80 %G   RBC 4.64 4.04 - 5.48 M/uL   Hemoglobin 13.3 12.2 - 16.2 g/dL   HCT, POC 37.5 (A) 37.7 - 47.9 %   MCV 80.8 80 - 97 fL   MCH, POC 28.7 27 - 31.2 pg   MCHC 35.6 (A) 31.8 - 35.4 g/dL   RDW, POC 14.2 %   Platelet Count, POC 123 (A) 142 - 424 K/uL   MPV 10.2 0 - 99.8 fL  POCT glucose (manual entry)  Result Value Ref Range   POC Glucose 101 (A) 70 - 99 mg/dl  POCT urinalysis dipstick  Result Value Ref Range   Color, UA yellow yellow   Clarity, UA clear clear   Glucose, UA negative negative   Bilirubin, UA negative negative   Ketones, POC UA negative negative   Spec Grav, UA 1.015    Blood, UA negative negative   pH, UA 5.0    Protein Ur, POC negative negative   Urobilinogen, UA 0.2    Nitrite, UA Negative Negative   Leukocytes, UA Negative Negative   EKG: NSR; no acute changes  Orthostatic VS for the past 24 hrs:  BP- Lying Pulse- Lying BP- Sitting Pulse- Sitting BP- Standing at 0 minutes Pulse- Standing at 0 minutes  01/26/16 1123 143/73 mmHg 62 129/69 mmHg 62 135/74 mmHg 73        Assessment & Plan:   1. Dizziness   2. Other chest pain   3. Pure hypercholesterolemia   4. Essential hypertension, benign   5. Gastroesophageal reflux disease without esophagitis   6. Benign paroxysmal positional vertigo, bilateral     -Chronic with recurrence. -Eply maneuvers provided to perform at home; highly suggestive of BPV.   -s/p ENT evaluation in [place]; son to fax records for ENT consult, MRI, carotid dopplers. -obtain labs -normal neurological  exam in office. -stable EKG; no arrhythmia; chest pain consistent with muscular etiology.  No exertional component to symptoms. -consider low dose meclizine if persists and formal physical therapy.   Orders Placed This Encounter  Procedures  . Comprehensive metabolic panel  . Lipid panel    Order Specific Question:  Has the patient fasted?    Answer:  Yes  . Orthostatic vital signs  . POCT CBC  . POCT glucose (manual entry)  . POCT urinalysis dipstick  . EKG 12-Lead   Meds ordered this encounter  Medications  . amLODipine (NORVASC) 2.5 MG tablet    Sig: Take 2.5 mg by mouth daily.  . candesartan (ATACAND) 8 MG tablet    Sig: Take 8 mg by mouth daily.  . simvastatin (ZOCOR) 10 MG tablet    Sig: Take 10 mg by mouth daily.    No Follow-up on file.    Itai Barbian Elayne Guerin, M.D. [facility] [address] [place], [place]  [address] [phone] phone [phone] fax

## 2016-01-26 NOTE — Patient Instructions (Addendum)
   IF you received an x-ray today, you will receive an invoice from [facility]. Please contact [facility] at [phone] with questions or concerns regarding your invoice.   IF you received labwork today, you will receive an invoice from [facility]. Please contact [facility] at [phone] with questions or concerns regarding your invoice.   Our billing staff will not be able to assist you with questions regarding bills from these companies.  You will be contacted with the lab results as soon as they are available. The fastest way to get your results is to activate your My Chart account. Instructions are located on the last page of this paperwork. If you have not heard from us regarding the results in 2 weeks, please contact this office.     Epley Maneuver Self-Care WHAT IS THE EPLEY MANEUVER? The Epley maneuver is an exercise you can do to relieve symptoms of benign paroxysmal positional vertigo (BPPV). This condition is often just referred to as vertigo. BPPV is caused by the movement of tiny crystals (canaliths) inside your inner ear. The accumulation and movement of canaliths in your inner ear causes a sudden spinning sensation (vertigo) when you move your head to certain positions. Vertigo usually lasts about 30 seconds. BPPV usually occurs in just one ear. If you get vertigo when you lie on your left side, you probably have BPPV in your left ear. Your health care provider can tell you which ear is involved.  BPPV may be caused by a head injury. Many people older than 50 get BPPV for unknown reasons. If you have been diagnosed with BPPV, your health care provider may teach you how to do this maneuver. BPPV is not life threatening (benign) and usually goes away in time.  WHEN SHOULD I PERFORM THE EPLEY MANEUVER? You can do this maneuver at home whenever you have symptoms of vertigo. You may do the Epley maneuver up to 3 times a day until your  symptoms of vertigo go away. HOW SHOULD I DO THE EPLEY MANEUVER? 1. Sit on the edge of a bed or table with your back straight. Your legs should be extended or hanging over the edge of the bed or table.  2. Turn your head halfway toward the affected ear.  3. Lie backward quickly with your head turned until you are lying flat on your back. You may want to position a pillow under your shoulders.  4. Hold this position for 30 seconds. You may experience an attack of vertigo. This is normal. Hold this position until the vertigo stops. 5. Then turn your head to the opposite direction until your unaffected ear is facing the floor.  6. Hold this position for 30 seconds. You may experience an attack of vertigo. This is normal. Hold this position until the vertigo stops. 7. Now turn your whole body to the same side as your head. Hold for another 30 seconds.  8. You can then sit back up. ARE THERE RISKS TO THIS MANEUVER? In some cases, you may have other symptoms (such as changes in your vision, weakness, or numbness). If you have these symptoms, stop doing the maneuver and call your health care provider. Even if doing these maneuvers relieves your vertigo, you may still have dizziness. Dizziness is the sensation of light-headedness but without the sensation of movement. Even though the Epley maneuver may relieve your vertigo, it is possible that your symptoms will return within 5 years. WHAT SHOULD I DO AFTER THIS   MANEUVER? After doing the Epley maneuver, you can return to your normal activities. Ask your doctor if there is anything you should do at home to prevent vertigo. This may include:  Sleeping with two or more pillows to keep your head elevated.  Not sleeping on the side of your affected ear.  Getting up slowly from bed.  Avoiding sudden movements during the day.  Avoiding extreme head movement, like looking up or bending over.  Wearing a cervical collar to prevent sudden head  movements. WHAT SHOULD I DO IF MY SYMPTOMS GET WORSE? Call your health care provider if your vertigo gets worse. Call your provider right way if you have other symptoms, including:   Nausea.  Vomiting.  Headache.  Weakness.  Numbness.  Vision changes.   This information is not intended to replace advice given to you by your health care provider. Make sure you discuss any questions you have with your health care provider.   Document Released: 11/03/2013 Document Reviewed: 11/03/2013 Elsevier Interactive Patient Education 2016 Elsevier Inc.  

## 2016-01-27 ENCOUNTER — Telehealth: Payer: Self-pay

## 2016-01-27 NOTE — Telephone Encounter (Signed)
[facility] received 30 pages of records form [facility] on this patient on 01/27/16.

## 2016-02-12 ENCOUNTER — Encounter: Payer: Self-pay | Admitting: Family Medicine

## 2016-02-17 ENCOUNTER — Emergency Department
Admission: EM | Admit: 2016-02-17 | Discharge: 2016-02-17 | Discharge status: Absent without leave | Payer: Medicare Other

## 2017-01-23 ENCOUNTER — Encounter: Payer: Self-pay | Admitting: Emergency Medicine

## 2017-01-23 ENCOUNTER — Ambulatory Visit (INDEPENDENT_AMBULATORY_CARE_PROVIDER_SITE_OTHER): Payer: Medicare Other | Admitting: Emergency Medicine

## 2017-01-23 VITALS — BP 132/66 | HR 76 | Temp 99.0°F | Resp 16 | Ht 62.5 in | Wt 175.2 lb

## 2017-01-23 DIAGNOSIS — R600 Localized edema: Secondary | ICD-10-CM | POA: Diagnosis not present

## 2017-01-23 DIAGNOSIS — E7800 Pure hypercholesterolemia, unspecified: Secondary | ICD-10-CM | POA: Diagnosis not present

## 2017-01-23 DIAGNOSIS — I10 Essential (primary) hypertension: Secondary | ICD-10-CM | POA: Insufficient documentation

## 2017-01-23 DIAGNOSIS — H8113 Benign paroxysmal vertigo, bilateral: Secondary | ICD-10-CM | POA: Insufficient documentation

## 2017-01-23 DIAGNOSIS — R42 Dizziness and giddiness: Secondary | ICD-10-CM

## 2017-01-23 MED ORDER — OMEPRAZOLE 40 MG PO CPDR: 40.0000 mg | DELAYED_RELEASE_CAPSULE | Freq: Every day | ORAL | 1 refills | Status: DC

## 2017-01-23 MED ORDER — AMLODIPINE BESYLATE 10 MG PO TABS: 10.0000 mg | ORAL_TABLET | Freq: Every day | ORAL | 3 refills | Status: DC

## 2017-01-23 MED ORDER — ALLOPURINOL 100 MG PO TABS: 100.0000 mg | ORAL_TABLET | Freq: Every day | ORAL | 3 refills | Status: DC

## 2017-01-23 MED ORDER — SIMVASTATIN 10 MG PO TABS: 10.0000 mg | ORAL_TABLET | Freq: Every day | ORAL | 5 refills | Status: DC

## 2017-01-23 NOTE — Progress Notes (Signed)
Ariel Kelly 81 y.o.   Chief Complaint  Patient presents with  . Medication Refill    allopurinol, amlodipine, prilosec and simvastatin  . Dizziness    sometimes    HISTORY OF PRESENT ILLNESS: This is a 81 y.o. female complaining of chronic dizziness; also needs medication refills.  HPI   Prior to Admission medications   Medication Sig Start Date End Date Taking? Authorizing Provider  allopurinol (ZYLOPRIM) 100 MG tablet Take 1 tablet (100 mg total) by mouth daily. 01/23/17 02/22/17 Yes Flem Enderle Joellen Jersey, MD  omeprazole (PRILOSEC) 40 MG capsule Take 1 capsule (40 mg total) by mouth daily. 01/23/17  Yes Granby, MD  simvastatin (ZOCOR) 10 MG tablet Take 1 tablet (10 mg total) by mouth daily. 01/23/17 02/22/17 Yes Cary, MD  amLODipine (NORVASC) 10 MG tablet Take 1 tablet (10 mg total) by mouth daily. 01/23/17   Horald Pollen, MD  candesartan (ATACAND) 8 MG tablet Take 8 mg by mouth daily.    Historical Provider, MD    Allergies  Allergen Reactions  . Asa [Aspirin] Other (See Comments)    GI UPSET    Patient Active Problem List   Diagnosis Date Noted  . Helicobacter pylori (H. pylori) infection 01/26/2014  . Tobacco abuse 12/24/2013  . HTN (hypertension) 12/24/2013  . Abdominal pain, epigastric 12/24/2013    Past Medical History:  Diagnosis Date  . Hyperlipidemia   . Hypertension   . Myocardial infarction     No past surgical history on file.  Social History   Social History  . Marital status: Widowed    Spouse name: N/A  . Number of children: N/A  . Years of education: N/A   Occupational History  . Not on file.   Social History Main Topics  . Smoking status: Current Every Day Smoker    Types: Cigarettes  . Smokeless tobacco: Never Used  . Alcohol use No  . Drug use: No  . Sexual activity: Not on file   Other Topics Concern  . Not on file   Social History Narrative  . No narrative on file    Family History   Problem Relation Age of Onset  . Heart disease Mother   . Cancer Father      Review of Systems  Constitutional: Negative.  Negative for chills, fever, malaise/fatigue and weight loss.  HENT: Positive for ear pain (left ear). Negative for hearing loss, nosebleeds and sore throat.   Eyes: Negative.  Negative for blurred vision, double vision and redness.  Respiratory: Negative for cough, shortness of breath and wheezing.   Cardiovascular: Positive for leg swelling (bilateral and chronic). Negative for chest pain, palpitations, orthopnea and claudication.  Gastrointestinal: Negative for abdominal pain, diarrhea, nausea and vomiting.  Genitourinary: Negative for dysuria and hematuria.  Musculoskeletal: Negative for back pain, myalgias and neck pain.  Skin: Negative for rash.  Neurological: Positive for dizziness. Negative for sensory change, speech change, focal weakness and headaches.  All other systems reviewed and are negative.   Vitals:   01/23/17 0837  BP: 132/66  Pulse: 76  Resp: 16  Temp: 99 F (37.2 C)    Physical Exam  Constitutional: She is oriented to person, place, and time. She appears well-developed and well-nourished.  HENT:  Head: Normocephalic and atraumatic.  Right Ear: Tympanic membrane, external ear and ear canal normal.  Left Ear: Tympanic membrane, external ear and ear canal normal.  Nose: Nose normal.  Mouth/Throat: No oropharyngeal exudate.  Eyes: Conjunctivae and EOM are normal. Pupils are equal, round, and reactive to light.  Neck: Normal range of motion. Neck supple. No JVD present. Carotid bruit is not present. No thyromegaly present.  Cardiovascular: Normal rate, regular rhythm, normal heart sounds and intact distal pulses.   Lower leg non-pitting edema  Pulmonary/Chest: Effort normal and breath sounds normal.  Abdominal: Soft. Bowel sounds are normal. She exhibits no distension. There is no tenderness.  Musculoskeletal: Normal range of motion.   Lymphadenopathy:    She has no cervical adenopathy.  Neurological: She is alert and oriented to person, place, and time. No sensory deficit. She exhibits normal muscle tone.  Skin: Skin is dry. Capillary refill takes less than 2 seconds.  Psychiatric: She has a normal mood and affect. Her behavior is normal.  Vitals reviewed.    ASSESSMENT & PLAN: Ariel Kelly was seen today for medication refill and dizziness.  Diagnoses and all orders for this visit:  Dizziness and giddiness -     CBC with Differential/Platelet -     Comprehensive metabolic panel  Essential hypertension, benign  Pure hypercholesterolemia -     Lipid panel  Benign paroxysmal positional vertigo, bilateral  Lower leg edema Comments: secondary to amlodipine use  Other orders -     allopurinol (ZYLOPRIM) 100 MG tablet; Take 1 tablet (100 mg total) by mouth daily. -     Cancel: amLODipine (NORVASC) 2.5 MG tablet; Take 1 tablet (2.5 mg total) by mouth daily. -     omeprazole (PRILOSEC) 40 MG capsule; Take 1 capsule (40 mg total) by mouth daily. -     simvastatin (ZOCOR) 10 MG tablet; Take 1 tablet (10 mg total) by mouth daily. -     amLODipine (NORVASC) 10 MG tablet; Take 1 tablet (10 mg total) by mouth daily.   Patient Instructions   We recommend that you schedule a mammogram for breast cancer screening. Typically, you do not need a referral to do this. Please contact a local imaging center to schedule your mammogram.  [facility] - [phone]  *ask for the Radiology Department [facility] - [phone] or [phone]  [facility] - [phone] [place] [phone] [facility] - [phone]  *ask for the [facility] - [phone]  *ask for the Radiology Department [facility] - [phone]  *ask for the [facility] - [phone]    IF  you received an x-ray today, you will receive an invoice from [facility]. Please contact [facility] at [phone] with questions or concerns regarding your invoice.   IF you received labwork today, you will receive an invoice from [facility]. Please contact [facility] at [phone] with questions or concerns regarding your invoice.   Our billing staff will not be able to assist you with questions regarding bills from these companies.  You will be contacted with the lab results as soon as they are available. The fastest way to get your results is to activate your My Chart account. Instructions are located on the last page of this paperwork. If you have not heard from [place] regarding the results in 2 weeks, please contact this office.          Agustina Caroli, MD [facility]

## 2017-01-23 NOTE — Patient Instructions (Addendum)
We recommend that you schedule a mammogram for breast cancer screening. Typically, you do not need a referral to do this. Please contact a local imaging center to schedule your mammogram.  [facility] - [phone]  *ask for the Radiology Department [facility] - [phone] or [phone]  [facility] - [phone] [facility] - [phone] [facility] - [phone]  *ask for the Radiology Department [facility] - [phone]  *ask for the Radiology Department [facility] - [phone]  *ask for the Mammography Department [facility] - [phone]     IF you received an x-ray today, you will receive an invoice from [facility]. Please contact [facility] at [phone] with questions or concerns regarding your invoice.   IF you received labwork today, you will receive an invoice from [facility]. Please contact [facility] at [phone] with questions or concerns regarding your invoice.   Our billing staff will not be able to assist you with questions regarding bills from these companies.  You will be contacted with the lab results as soon as they are available. The fastest way to get your results is to activate your My Chart account. Instructions are located on the last page of this paperwork. If you have not heard from us regarding the results in 2 weeks, please contact this office.      

## 2017-01-24 ENCOUNTER — Other Ambulatory Visit: Payer: Self-pay | Admitting: Emergency Medicine

## 2017-01-24 DIAGNOSIS — R944 Abnormal results of kidney function studies: Secondary | ICD-10-CM

## 2017-01-24 LAB — COMPREHENSIVE METABOLIC PANEL
ALK PHOS: 56 IU/L (ref 39–117)
ALT: 12 IU/L (ref 0–32)
AST: 16 IU/L (ref 0–40)
Albumin/Globulin Ratio: 1.6 (ref 1.2–2.2)
Albumin: 4.4 g/dL (ref 3.5–4.8)
BUN/Creatinine Ratio: 15 (ref 12–28)
BUN: 19 mg/dL (ref 8–27)
Bilirubin Total: 0.3 mg/dL (ref 0.0–1.2)
CO2: 27 mmol/L (ref 18–29)
Calcium: 10.1 mg/dL (ref 8.7–10.3)
Chloride: 101 mmol/L (ref 96–106)
Creatinine, Ser: 1.24 mg/dL — ABNORMAL HIGH (ref 0.57–1.00)
GFR calc Af Amer: 48 mL/min/{1.73_m2} — ABNORMAL LOW (ref >59)
GFR calc non Af Amer: 42 mL/min/{1.73_m2} — ABNORMAL LOW (ref >59)
GLOBULIN, TOTAL: 2.7 g/dL (ref 1.5–4.5)
GLUCOSE: 97 mg/dL (ref 65–99)
Potassium: 4.7 mmol/L (ref 3.5–5.2)
SODIUM: 140 mmol/L (ref 134–144)
Total Protein: 7.1 g/dL (ref 6.0–8.5)

## 2017-01-24 LAB — CBC WITH DIFFERENTIAL/PLATELET
BASOS ABS: 0 10*3/uL (ref 0.0–0.2)
Basos: 0 %
EOS (ABSOLUTE): 0.3 10*3/uL (ref 0.0–0.4)
Eos: 4 %
Hematocrit: 38.8 % (ref 34.0–46.6)
Hemoglobin: 12.9 g/dL (ref 11.1–15.9)
IMMATURE GRANULOCYTES: 0 %
Immature Grans (Abs): 0 10*3/uL (ref 0.0–0.1)
LYMPHS ABS: 1.7 10*3/uL (ref 0.7–3.1)
Lymphs: 23 %
MCH: 27 pg (ref 26.6–33.0)
MCHC: 33.2 g/dL (ref 31.5–35.7)
MCV: 81 fL (ref 79–97)
MONOS ABS: 0.6 10*3/uL (ref 0.1–0.9)
Monocytes: 9 %
NEUTROS PCT: 64 %
Neutrophils Absolute: 4.7 10*3/uL (ref 1.4–7.0)
PLATELETS: 175 10*3/uL (ref 150–379)
RBC: 4.77 x10E6/uL (ref 3.77–5.28)
RDW: 14.3 % (ref 12.3–15.4)
WBC: 7.4 10*3/uL (ref 3.4–10.8)

## 2017-01-24 LAB — LIPID PANEL
CHOLESTEROL TOTAL: 175 mg/dL (ref 100–199)
Chol/HDL Ratio: 3.1 ratio units (ref 0.0–4.4)
HDL: 57 mg/dL (ref >39)
LDL CALC: 80 mg/dL (ref 0–99)
TRIGLYCERIDES: 188 mg/dL — AB (ref 0–149)
VLDL Cholesterol Cal: 38 mg/dL (ref 5–40)

## 2017-01-30 ENCOUNTER — Encounter: Payer: Self-pay | Admitting: *Deleted

## 2017-03-26 ENCOUNTER — Telehealth: Payer: Self-pay | Admitting: Family Medicine

## 2017-03-26 ENCOUNTER — Other Ambulatory Visit: Payer: Self-pay | Admitting: Emergency Medicine

## 2017-03-26 MED ORDER — SIMVASTATIN 10 MG PO TABS: 10.0000 mg | ORAL_TABLET | Freq: Every day | ORAL | 3 refills | Status: AC

## 2017-03-26 MED ORDER — OMEPRAZOLE 40 MG PO CPDR: 40.0000 mg | DELAYED_RELEASE_CAPSULE | Freq: Every day | ORAL | 1 refills | Status: DC

## 2017-03-26 MED ORDER — AMLODIPINE BESYLATE 10 MG PO TABS: 10.0000 mg | ORAL_TABLET | Freq: Every day | ORAL | 3 refills | Status: DC

## 2017-03-26 MED ORDER — ALLOPURINOL 100 MG PO TABS: 100.0000 mg | ORAL_TABLET | Freq: Every day | ORAL | 3 refills | Status: DC

## 2017-03-26 NOTE — Telephone Encounter (Signed)
[facility] CALLING STATING THAT PT WILL BE GOING OUT OF TOWN AND NEED A 90 REFILL ON ALLOPURINOL PLEASE CALL THEM TO GET PERMISSION TO FILL

## 2017-03-29 ENCOUNTER — Other Ambulatory Visit: Payer: Self-pay | Admitting: Emergency Medicine

## 2017-04-02 ENCOUNTER — Telehealth: Payer: Self-pay | Admitting: General Practice

## 2017-04-02 NOTE — Telephone Encounter (Signed)
[facility] is calling to check on status of the refill request for allopurinol and the patient is needing a 90day supply   Best number for [facility] [phone]

## 2017-04-02 NOTE — Telephone Encounter (Signed)
01/29/17 last ov

## 2017-04-03 NOTE — Telephone Encounter (Signed)
OK to refill

## 2017-04-03 NOTE — Telephone Encounter (Signed)
Refill was for 30 with 3 refills so okd 90 day no refills

## 2017-12-25 ENCOUNTER — Ambulatory Visit (INDEPENDENT_AMBULATORY_CARE_PROVIDER_SITE_OTHER): Payer: Medicare Other | Admitting: Physician Assistant

## 2017-12-25 ENCOUNTER — Encounter: Payer: Self-pay | Admitting: Physician Assistant

## 2017-12-25 ENCOUNTER — Ambulatory Visit (INDEPENDENT_AMBULATORY_CARE_PROVIDER_SITE_OTHER): Payer: Medicare Other

## 2017-12-25 ENCOUNTER — Other Ambulatory Visit: Payer: Self-pay

## 2017-12-25 VITALS — BP 128/71 | HR 63 | Temp 98.5°F | Resp 16

## 2017-12-25 DIAGNOSIS — M2170 Unequal limb length (acquired), unspecified site: Secondary | ICD-10-CM

## 2017-12-25 DIAGNOSIS — M160 Bilateral primary osteoarthritis of hip: Secondary | ICD-10-CM | POA: Diagnosis not present

## 2017-12-25 DIAGNOSIS — M79605 Pain in left leg: Secondary | ICD-10-CM

## 2017-12-25 MED ORDER — TRAMADOL-ACETAMINOPHEN 37.5-325 MG PO TABS: 1.0000 | ORAL_TABLET | Freq: Four times a day (QID) | ORAL | 0 refills | Status: AC | PRN

## 2017-12-25 NOTE — Progress Notes (Signed)
Ariel Kelly  MRN: [id number] DOB: 03/20/1936  PCP: Patient, No Pcp Per  Subjective:  Pt is a 82 year old female PMH HTN, HLD who presents to clinic for left leg pain. She is here today with her daughter who is interpreting. Her pain started last night. Pain is of groin, front and sides of thigh, beside knees and of the front and back of her calf.  Denies leg swelling, increased warmth, cough, shob, chest pain, palpitations, n/t, recent weight loss.  Her daughter says she made the pt walk through a department store yesterday, which is more walking than the pt is used to doing on a regular basis.  She has not taken anything for pain.  Daughter states pt has h/o DVT "a long time ago".   Review of Systems  Constitutional: Negative for diaphoresis, fatigue and unexpected weight change.  Respiratory: Negative for cough, chest tightness, shortness of breath and wheezing.   Cardiovascular: Negative for chest pain, palpitations and leg swelling.  Musculoskeletal: Positive for arthralgias, gait problem and myalgias. Negative for back pain and joint swelling.  Skin: Negative.     Patient Active Problem List   Diagnosis Date Noted  . Dizziness and giddiness 01/23/2017  . Essential hypertension, benign 01/23/2017  . Pure hypercholesterolemia 01/23/2017  . Benign paroxysmal positional vertigo, bilateral 01/23/2017  . Lower leg edema 01/23/2017  . Helicobacter pylori (H. pylori) infection 01/26/2014  . Tobacco abuse 12/24/2013  . HTN (hypertension) 12/24/2013  . Abdominal pain, epigastric 12/24/2013    Current Outpatient Medications on File Prior to Visit  Medication Sig Dispense Refill  . amLODipine (NORVASC) 10 MG tablet Take 1 tablet (10 mg total) by mouth daily. 90 tablet 3  . candesartan (ATACAND) 8 MG tablet Take 8 mg by mouth daily.    Marland Kitchen omeprazole (PRILOSEC) 40 MG capsule Take 1 capsule (40 mg total) by mouth daily. 180 capsule 1  . allopurinol (ZYLOPRIM) 100 MG tablet Take 1  tablet (100 mg total) by mouth daily. 30 tablet 3  . allopurinol (ZYLOPRIM) 100 MG tablet TAKE 1 TABLET (100 MG TOTAL) BY MOUTH DAILY. 30 tablet 3  . simvastatin (ZOCOR) 10 MG tablet Take 1 tablet (10 mg total) by mouth daily. 90 tablet 3   No current facility-administered medications on file prior to visit.     Allergies  Allergen Reactions  . Asa [Aspirin] Other (See Comments)    GI UPSET     Objective:  BP 128/71   Pulse 63   Temp 98.5 F (36.9 C) (Oral)   Resp 16   SpO2 98%   Physical Exam  Musculoskeletal:       Left hip: She exhibits tenderness and bony tenderness. She exhibits normal range of motion, normal strength, no swelling, no crepitus and no deformity.       Right ankle: She exhibits no swelling.       Left ankle: She exhibits no swelling.       Right upper leg: She exhibits no tenderness, no bony tenderness, no swelling and no edema.       Left upper leg: She exhibits tenderness. She exhibits no bony tenderness, no swelling and no edema.       Right lower leg: She exhibits no tenderness, no bony tenderness, no swelling and no edema.       Left lower leg: She exhibits tenderness. She exhibits no bony tenderness, no swelling and no edema.  No warmth, erythema, swelling of left leg. Not warm to  touch. TTP nearly entire leg, including palpation of anterior tibia.  Left lower leg 40.5 cm, right lower leg 40 cm Left thigh 51.5, and right thigh 50.5 No palpable cords. Negative Homen's sign.  When laying down, left leg appears 3cm longer than right.    Dg Hips Bilat W Or W/o Pelvis 2v  Result Date: 12/25/2017 CLINICAL DATA:  Left hip pain, left leg pain EXAM: DG HIP (WITH OR WITHOUT PELVIS) 2V BILAT COMPARISON:  None. FINDINGS: Bones:  No fracture or dislocation.  Generalized osteopenia. Joints: Normal alignment. No erosive changes. Small left femoral marginal osteophyte. Joint spaces are otherwise relatively well maintained. Soft tissue: No soft tissue abnormality. No  radiopaque foreign body. No subcutaneous emphysema. Chondrocalcinosis of the pubic symphysis as can be seen with CPPD. IMPRESSION: Minimal osteoarthritis of the left hip. Electronically Signed   By: Kathreen Devoid   On: 12/25/2017 11:15    Assessment and Plan :  1. Pain of left lower extremity 2. Leg length discrepancy - traMADol-acetaminophen (ULTRACET) 37.5-325 MG tablet; Take 1 tablet by mouth every 6 (six) hours as needed.  Dispense: 20 tablet; Refill: 0 - DG HIPS BILAT W OR W/O PELVIS 2V; Future - Pt c/o left leg pain x 2 days. X-ray of hip is negative. No concerning findings on PE or HPI today. Low suspicion for DVT. Suspect exercise induced muscle pain due to excessive walking yesterday. Advised heat, stretching, pain control. RTC if symptoms worsen. She understands and agrees with plan  Mercer Pod, PA-C  [facility] 12/25/2017 10:42 AM

## 2017-12-25 NOTE — Patient Instructions (Addendum)
Your x-ray of the hips is great.  I suspect you leg pain is due to muscles.  Apply heating pad to your muscles. You can also soak in a warm bath.  Take tylenol for pain.  You may take Ultracet if needed (see below) this may make you constipated. Be sure to eat fiber and stay hydrated.  Stay well hydrated.  Stretch your leg muscles.  This may take 5 days to get better.  Come back if you are not better in 5-7 days.   Thank you for coming in today. I hope you feel we met your needs.  Feel free to call PCP if you have any questions or further requests.  Please consider signing up for MyChart if you do not already have it, as this is a great way to communicate with me.  Best,  Whitney McVey, PA-C  Acetaminophen; Tramadol tablets What is this medicine? ACETAMINOPHEN; TRAMADOL (a set a MEE noe fen; TRA ma dole) is a pain reliever. It is used to treat short term moderate pain. This medicine may be used for other purposes; ask your health care provider or pharmacist if you have questions. COMMON BRAND NAME(S): Ultracet What should I tell my health care provider before I take this medicine? They need to know if you have any of these conditions: -brain tumor -depression -drug abuse or addiction -head injury -if you often drink alcohol -kidney disease or trouble passing urine -liver disease -lung disease, asthma, or breathing problems -seizures or epilepsy -suicidal thoughts, plans, or attempt; a previous suicide attempt by you or a family member -an unusual or allergic reaction to acetaminophen, tramadol, codeine, other opioid analgesics, other medicines, foods, dyes, or preservatives -pregnant or trying to get pregnant -breast-feeding How should I use this medicine? Take this medicine by mouth with a full glass of water. Follow the directions on the prescription label. If the medicine upsets your stomach, take it with food or milk. Do not take your medicine more often than directed. A  special MedGuide will be given to you by the pharmacist with each prescription and refill. Be sure to read this information carefully each time. Talk to your pediatrician regarding the use of this medicine in children. Special care may be needed. Overdosage: If you think you have taken too much of this medicine contact a poison control center or emergency room at once. NOTE: This medicine is only for you. Do not share this medicine with others. What if I miss a dose? If you miss a dose, take it as soon as you can. If it is almost time for your next dose, take only that dose. Do not take double or extra doses. What may interact with this medicine? Do not take this medication with any of the following medicines: -MAOIs like Carbex, Eldepryl, Marplan, Nardil, and Parnate This medicine may also interact with the following medications: -alcohol -antihistamines for allergy, cough and cold -certain medicines for anxiety or sleep -certain medicines for depression like amitriptyline, fluoxetine, sertraline -certain medicines for migraine headache like almotriptan, eletriptan, frovatriptan, naratriptan, rizatriptan, sumatriptan, zolmitriptan -certain medicines for seizures like carbamazepine, oxcarbazepine, phenobarbital, primidone -certain medicines that treat or prevent blood clots like warfarin -digoxin -furazolidone -general anesthetics like halothane, isoflurane, methoxyflurane, propofol -linezolid -local anesthetics like lidocaine, pramoxine, tetracaine -medicines that relax muscles for surgery -other narcotic medicines for pain or cough -phenothiazines like chlorpromazine, mesoridazine, prochlorperazine, thioridazine -procarbazine This list may not describe all possible interactions. Give your health care provider a list of all  the medicines, herbs, non-prescription drugs, or dietary supplements you use. Also tell them if you smoke, drink alcohol, or use illegal drugs. Some items may interact  with your medicine. What should I watch for while using this medicine? Tell your doctor or health care professional if your pain does not go away, if it gets worse, or if you have new or a different type of pain. You may develop tolerance to the medicine. Tolerance means that you will need a higher dose of the medicine for pain relief. Tolerance is normal and is expected if you take the medicine for a long time. Do not suddenly stop taking your medicine because you may develop a severe reaction. Your body becomes used to the medicine. This does NOT mean you are addicted. Addiction is a behavior related to getting and using a drug for a non-medical reason. If you have pain, you have a medical reason to take pain medicine. Your doctor will tell you how much medicine to take. If your doctor wants you to stop the medicine, the dose will be slowly lowered over time to avoid any side effects. There are different types of narcotic medicines (opiates). If you take more than one type at the same time or if you are taking another medicine that also causes drowsiness, you may have more side effects. Give your health care provider a list of all medicines you use. Your doctor will tell you how much medicine to take. Do not take more medicine than directed. Call emergency for help if you have problems breathing or unusual sleepiness. Do not take other medicines that contain acetaminophen with this medicine. Always read labels carefully. If you have questions, ask your doctor or pharmacist. If you take too much acetaminophen get medical help right away. Too much acetaminophen can be very dangerous and cause liver damage. Even if you do not have symptoms, it is important to get help right away. You may get drowsy or dizzy. Do not drive, use machinery, or do anything that needs mental alertness until you know how this medicine affects you. Do not stand or sit up quickly, especially if you are an older patient. This reduces  the risk of dizzy or fainting spells. Alcohol may interfere with the effect of this medicine. Avoid alcoholic drinks. This medicine will cause constipation. Try to have a bowel movement at least every 2 to 3 days. If you do not have a bowel movement for 3 days, call your doctor or health care professional. Your mouth may get dry. Chewing sugarless gum or sucking hard candy, and drinking plenty of water may help. Contact your doctor if the problem does not go away or is severe. What side effects may I notice from receiving this medicine? Side effects that you should report to your doctor or health care professional as soon as possible: -allergic reactions like skin rash, itching or hives, swelling of the face, lips, or tongue -breathing problems -confusion -redness, blistering, peeling or loosening of the skin, including inside the mouth -seizures -signs and symptoms of low blood pressure like dizziness; feeling faint or lightheaded, falls; unusually weak or tired -trouble passing urine or change in the amount of urine -yellowing of the eyes or skin Side effects that usually do not require medical attention (report to your doctor or health care professional if they continue or are bothersome): -constipation -dry mouth -nausea, vomiting -tiredness This list may not describe all possible side effects. Call your doctor for medical advice about side effects.  You may report side effects to FDA at 1-800-FDA-1088. Where should I keep my medicine? Keep out of the reach of children. Tramadol is a morphine-like drug that can be abused. Keep your medicine in a safe place to protect it from theft. Do not share this medicine with anyone. Selling or giving away this medicine is dangerous and is against the law. This medicine may cause accidental overdose and death if it taken by other adults, children, or pets. Mix any unused medicine with a substance like cat litter or coffee grounds. Then throw the medicine  away in a sealed container like a sealed bag or a coffee can with a lid. Do not use the medicine after the expiration date. Store at room temperature between 15 and 30 degrees C (59 and 86 degrees F). NOTE: This sheet is a summary. It may not cover all possible information. If you have questions about this medicine, talk to your doctor, pharmacist, or health care provider.  2018 Elsevier/Gold Standard (2015-07-23 10:58:16)  IF you received an x-ray today, you will receive an invoice from [facility]. Please contact [facility] at [phone] with questions or concerns regarding your invoice.   IF you received labwork today, you will receive an invoice from [facility]. Please contact [facility] at [phone] with questions or concerns regarding your invoice.   Our billing staff will not be able to assist you with questions regarding bills from these companies.  You will be contacted with the lab results as soon as they are available. The fastest way to get your results is to activate your My Chart account. Instructions are located on the last page of this paperwork. If you have not heard from [place] regarding the results in 2 weeks, please contact this office.

## 2018-05-03 ENCOUNTER — Other Ambulatory Visit: Payer: Self-pay | Admitting: Emergency Medicine

## 2018-07-30 ENCOUNTER — Other Ambulatory Visit: Payer: Self-pay | Admitting: Emergency Medicine

## 2018-07-30 NOTE — Telephone Encounter (Signed)
Allopurinol refill Last Refill:05/03/18 # 90 with 0 refill. Rx expired Last OV: 12/25/17 PCP: Dr. Mitchel Honour Pharmacy:[facility] [address]

## 2018-08-16 ENCOUNTER — Other Ambulatory Visit: Payer: Self-pay | Admitting: Emergency Medicine

## 2018-08-18 NOTE — Telephone Encounter (Signed)
Patient called, left VM on cell phone listed. Patient called on home phone listed and her son in law Reyad Jajjar answered. I verified per DPR that I am able to speak to him. Advised the call is to speak to the patient to schedule and appointment for medication refill, he says the patient is out of the [place] at this time. I advised I will refuse the medication, he verbalized understanding.

## 2019-11-27 IMAGING — DX DG HIP (WITH OR WITHOUT PELVIS) 2V BILAT
3 series · 3 of 3 positions shown · non-contrast
Comparison: None.

CLINICAL DATA: Left hip pain, left leg pain

EXAM:
DG HIP (WITH OR WITHOUT PELVIS) 2V BILAT

[pelvis ap]
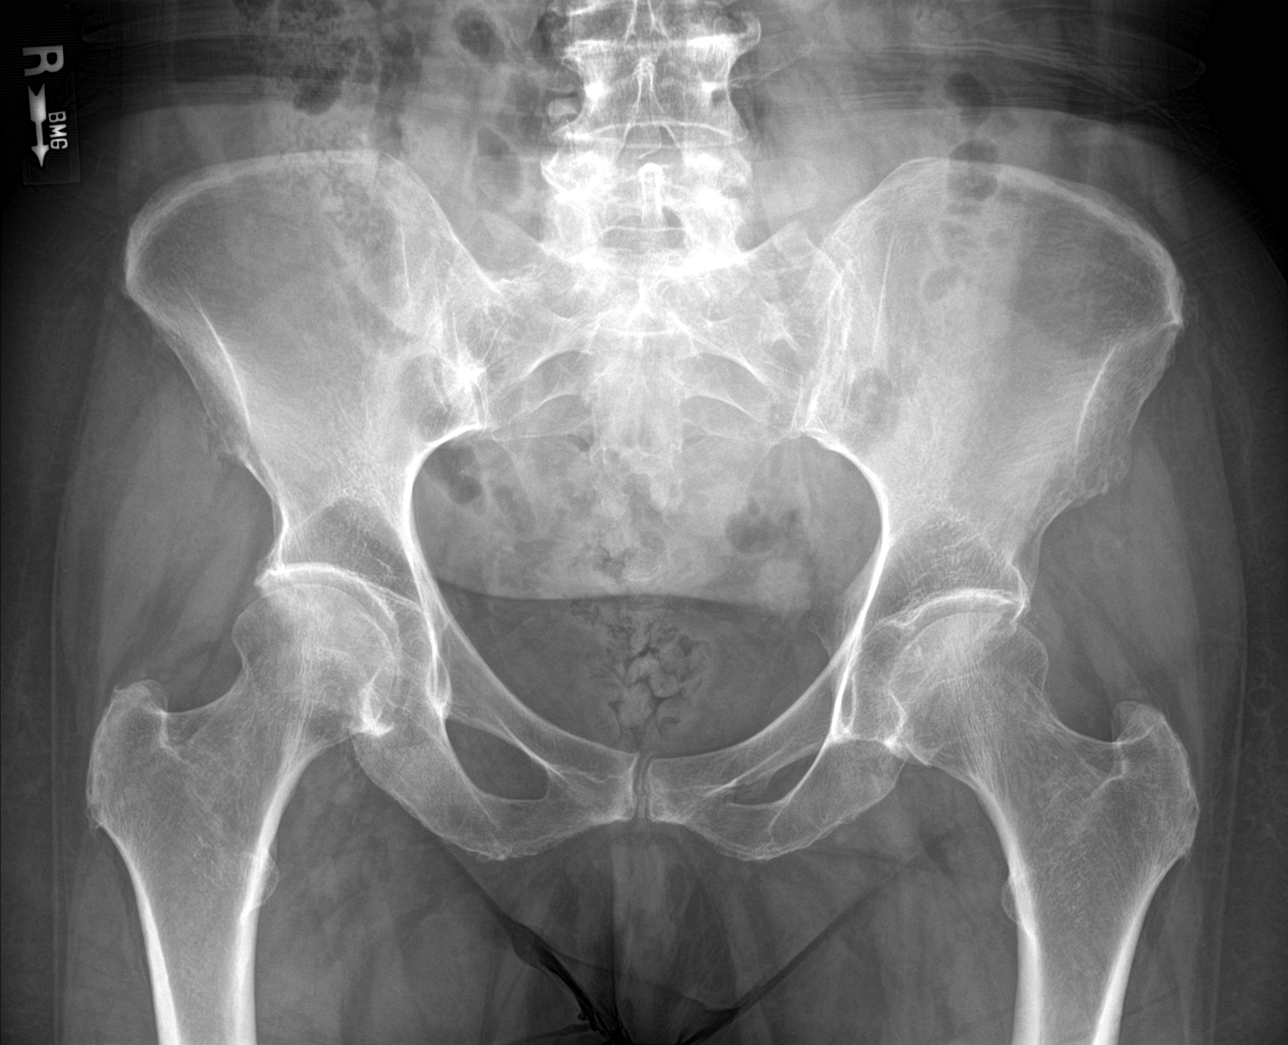

[hip lat (1 of 2)]
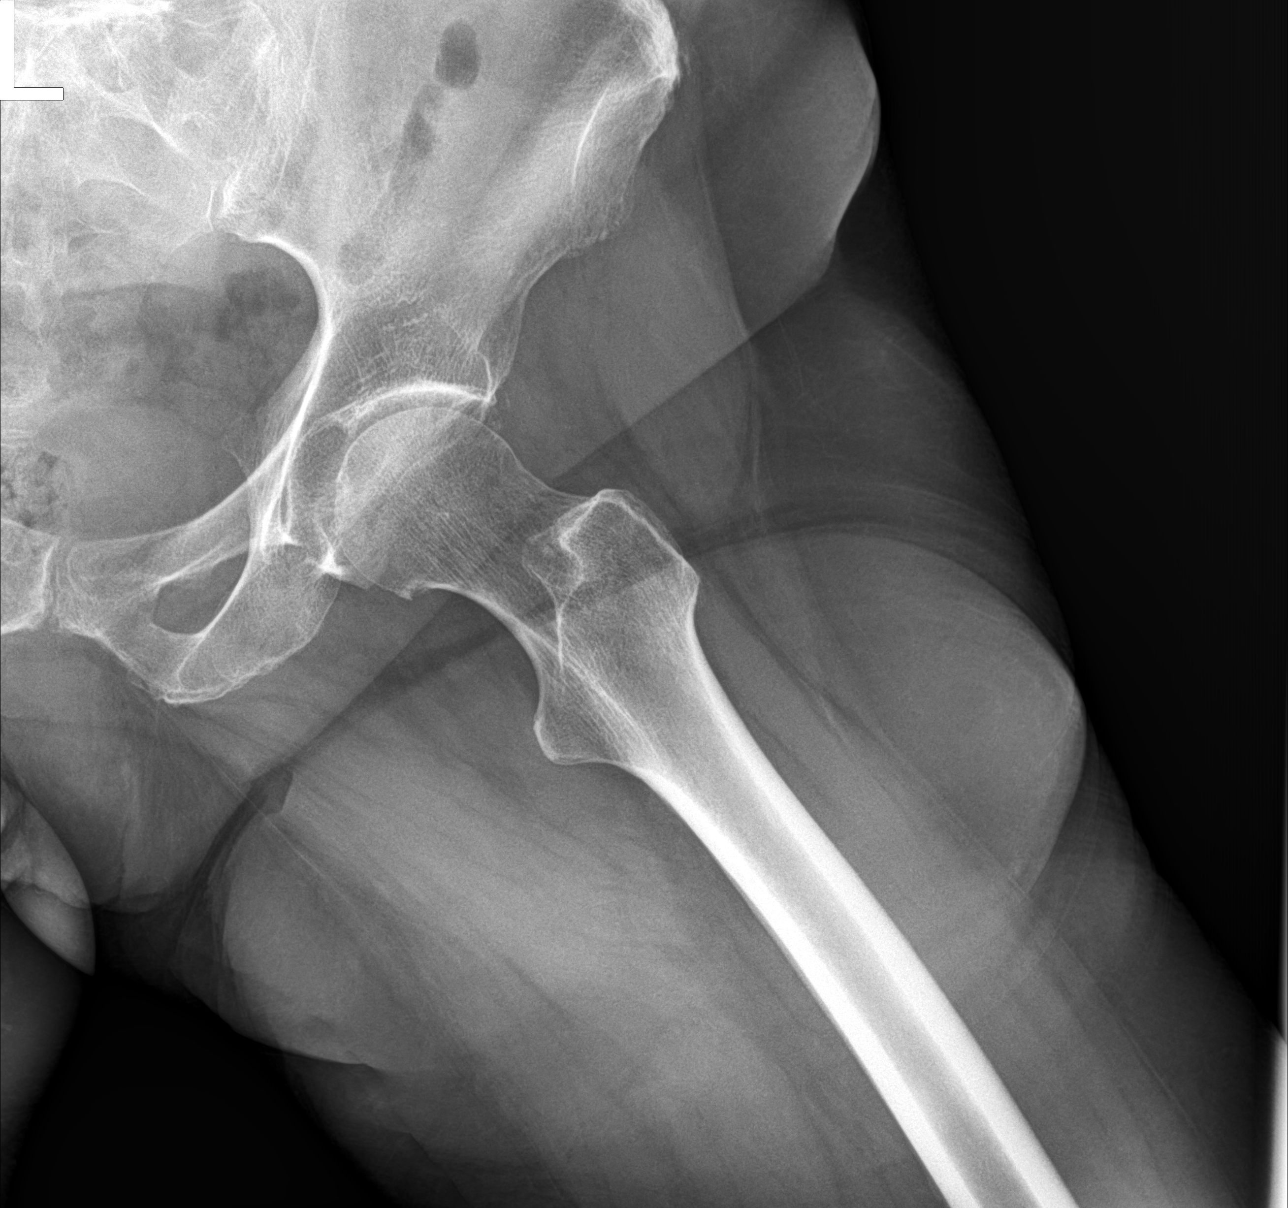

[hip lat (2 of 2)]
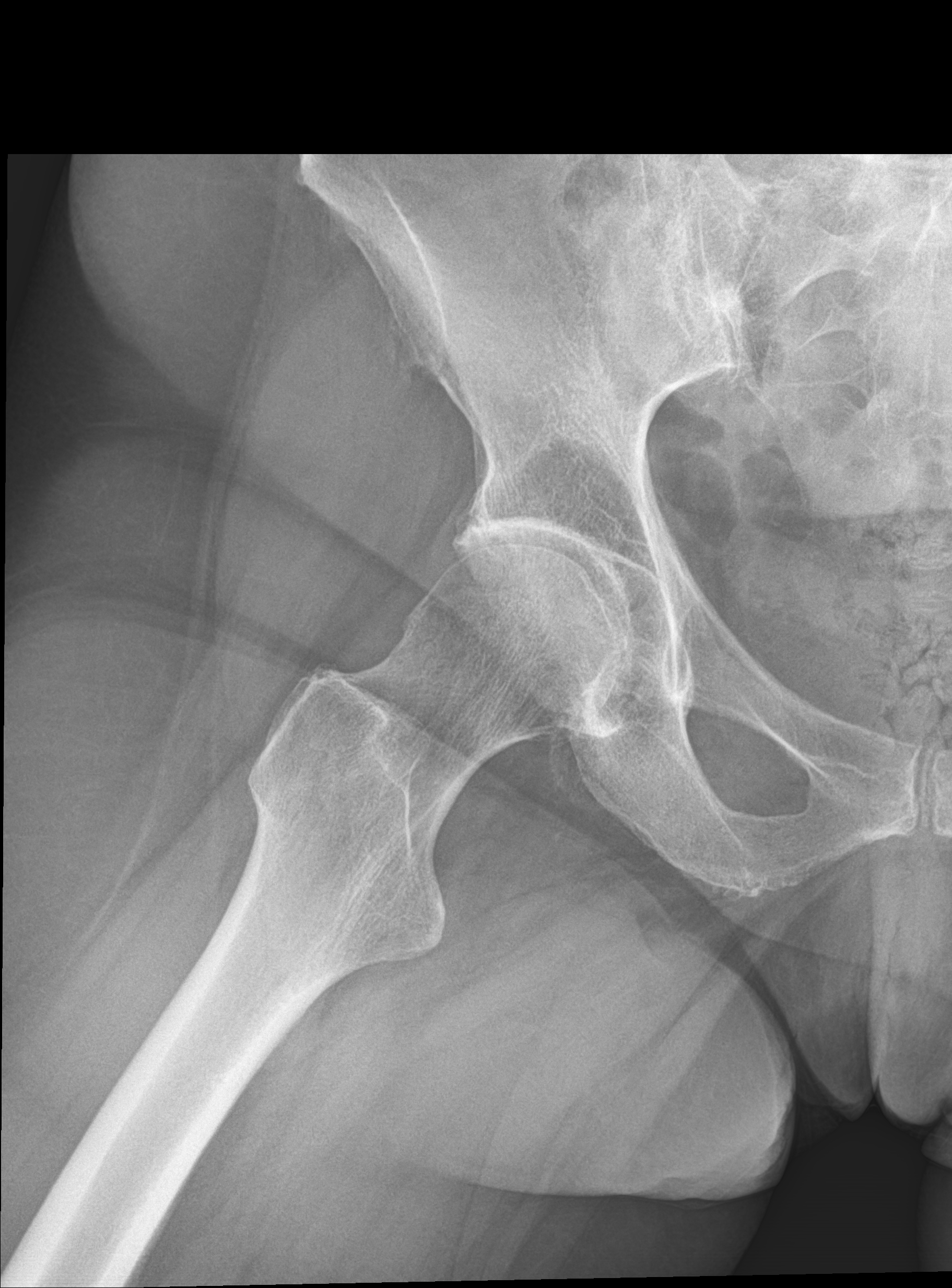

[3 of 3 positions shown; findings below may reference images not displayed]

FINDINGS: Bones:  No fracture or dislocation.  Generalized osteopenia.

Joints: Normal alignment. No erosive changes. Small left femoral
marginal osteophyte. Joint spaces are otherwise relatively well
maintained.

Soft tissue: No soft tissue abnormality. No radiopaque foreign body.
No subcutaneous emphysema. Chondrocalcinosis of the pubic symphysis
as can be seen with CPPD.
IMPRESSION: Minimal osteoarthritis of the left hip.

## 2020-05-17 ENCOUNTER — Other Ambulatory Visit: Payer: Self-pay

## 2020-05-17 ENCOUNTER — Encounter: Payer: Self-pay | Admitting: Emergency Medicine

## 2020-05-17 ENCOUNTER — Ambulatory Visit (INDEPENDENT_AMBULATORY_CARE_PROVIDER_SITE_OTHER): Payer: Medicare Other | Admitting: Emergency Medicine

## 2020-05-17 VITALS — BP 132/60 | HR 76 | Temp 98.2°F | Ht 62.0 in | Wt 160.8 lb

## 2020-05-17 DIAGNOSIS — Z7689 Persons encountering health services in other specified circumstances: Secondary | ICD-10-CM

## 2020-05-17 DIAGNOSIS — E785 Hyperlipidemia, unspecified: Secondary | ICD-10-CM

## 2020-05-17 DIAGNOSIS — I10 Essential (primary) hypertension: Secondary | ICD-10-CM

## 2020-05-17 DIAGNOSIS — K5909 Other constipation: Secondary | ICD-10-CM | POA: Diagnosis not present

## 2020-05-17 MED ORDER — AMLODIPINE BESYLATE 10 MG PO TABS: 10.0000 mg | ORAL_TABLET | Freq: Every day | ORAL | 1 refills | Status: DC

## 2020-05-17 NOTE — Patient Instructions (Addendum)
Take Metamucil fiber supplement daily. May use occasional Fleet enemas as needed.    If you have lab work done today you will be contacted with your lab results within the next 2 weeks.  If you have not heard from [place] then please contact us. The fastest way to get your results is to register for My Chart.   IF you received an x-ray today, you will receive an invoice from [facility]. Please contact [facility] at [phone] with questions or concerns regarding your invoice.   IF you received labwork today, you will receive an invoice from [place]. Please contact [facility] at [phone] with questions or concerns regarding your invoice.   Our billing staff will not be able to assist you with questions regarding bills from these companies.  You will be contacted with the lab results as soon as they are available. The fastest way to get your results is to activate your My Chart account. Instructions are located on the last page of this paperwork. If you have not heard from [place] regarding the results in 2 weeks, please contact this office.      Constipation, Adult Constipation is when a person:  Poops (has a bowel movement) fewer times in a week than normal.  Has a hard time pooping.  Has poop that is dry, hard, or bigger than normal. Follow these instructions at home: Eating and drinking   Eat foods that have a lot of fiber, such as: ? Fresh fruits and vegetables. ? Whole grains. ? Beans.  Eat less of foods that are high in fat, low in fiber, or overly processed, such as: ? [place] fries. ? Hamburgers. ? Cookies. ? Candy. ? Soda.  Drink enough fluid to keep your pee (urine) clear or pale yellow. General instructions  Exercise regularly or as told by your doctor.  Go to the restroom when you feel like you need to poop. Do not hold it in.  Take over-the-counter and prescription medicines only as told by your doctor. These include any fiber  supplements.  Do pelvic floor retraining exercises, such as: ? Doing deep breathing while relaxing your lower belly (abdomen). ? Relaxing your pelvic floor while pooping.  Watch your condition for any changes.  Keep all follow-up visits as told by your doctor. This is important. Contact a doctor if:  You have pain that gets worse.  You have a fever.  You have not pooped for 4 days.  You throw up (vomit).  You are not hungry.  You lose weight.  You are bleeding from the anus.  You have thin, pencil-like poop (stool). Get help right away if:  You have a fever, and your symptoms suddenly get worse.  You leak poop or have blood in your poop.  Your belly feels hard or bigger than normal (is bloated).  You have very bad belly pain.  You feel dizzy or you faint. This information is not intended to replace advice given to you by your health care provider. Make sure you discuss any questions you have with your health care provider. Document Revised: 10/11/2017 Document Reviewed: 04/18/2016 Elsevier Patient Education  2020 [employer].

## 2020-05-17 NOTE — Progress Notes (Signed)
Ariel Kelly 84 y.o.   Chief Complaint  Patient presents with  . Transitions Of Care  . Abdominal Pain    has a sour taste in mouth     HISTORY OF PRESENT ILLNESS: This is a 84 y.o. female here to establish care with me. Has history of hypertension, on medication, amlodipine 10 mg and candesartan 8 mg daily. History of dyslipidemia but not taking simvastatin 10 mg as prescribed. History of gout but not taking allopurinol as prescribed. Has occasional epigastric pain with heartburn and sour taste in her mouth.  Asymptomatic today.  Occasionally takes Prilosec. Has history of chronic constipation.  Uses Dulcolax laxatives as needed.  Has used enemas in the past with success. Here with daughter today who is doing translation for her. Otherwise no complaints or medical concerns today.  HPI   Prior to Admission medications   Medication Sig Start Date End Date Taking? Authorizing Provider  allopurinol (ZYLOPRIM) 100 MG tablet Take 1 tablet (100 mg total) by mouth daily. 03/26/17 04/25/17  Horald Pollen, MD  allopurinol (ZYLOPRIM) 100 MG tablet TAKE 1 TABLET (100 MG TOTAL) BY MOUTH DAILY. 05/03/18 06/02/18  Horald Pollen, MD  amLODipine (NORVASC) 10 MG tablet Take 1 tablet (10 mg total) by mouth daily. 03/26/17   Horald Pollen, MD  candesartan (ATACAND) 8 MG tablet Take 8 mg by mouth daily.    [provider]  omeprazole (PRILOSEC) 40 MG capsule Take 1 capsule (40 mg total) by mouth daily. 03/26/17   Horald Pollen, MD  simvastatin (ZOCOR) 10 MG tablet Take 1 tablet (10 mg total) by mouth daily. 03/26/17 04/25/17  Horald Pollen, MD  traMADol-acetaminophen (ULTRACET) 37.5-325 MG tablet Take 1 tablet by mouth every 6 (six) hours as needed. 12/25/17   McVey, Gelene Mink, PA-C    Allergies  Allergen Reactions  . Asa [Aspirin] Other (See Comments)    GI UPSET    Patient Active Problem List   Diagnosis Date Noted  . Essential  hypertension, benign 01/23/2017  . Pure hypercholesterolemia 01/23/2017  . Benign paroxysmal positional vertigo, bilateral 01/23/2017  . Tobacco abuse 12/24/2013  . HTN (hypertension) 12/24/2013    Past Medical History:  Diagnosis Date  . Hyperlipidemia   . Hypertension   . Myocardial infarction [facility])     History reviewed. No pertinent surgical history.  Social History   Socioeconomic History  . Marital status: Widowed    Spouse name: Not on file  . Number of children: Not on file  . Years of education: Not on file  . Highest education level: Not on file  Occupational History  . Not on file  Tobacco Use  . Smoking status: Current Every Day Smoker    Types: Cigarettes  . Smokeless tobacco: Never Used  Substance and Sexual Activity  . Alcohol use: No  . Drug use: No  . Sexual activity: Not on file  Other Topics Concern  . Not on file  Social History Narrative  . Not on file   Social Determinants of Health   Financial Resource Strain:   . Difficulty of Paying Living Expenses:   Food Insecurity:   . Worried About Charity fundraiser in the Last Year:   . Arboriculturist in the Last Year:   Transportation Needs:   . Film/video editor (Medical):   Marland Kitchen Lack of Transportation (Non-Medical):   Physical Activity:   . Days of Exercise per Week:   . Minutes of  Exercise per Session:   Stress:   . Feeling of Stress :   Social Connections:   . Frequency of Communication with Friends and Family:   . Frequency of Social Gatherings with Friends and Family:   . Attends Religious Services:   . Active Member of Clubs or Organizations:   . Attends Archivist Meetings:   Marland Kitchen Marital Status:   Intimate Partner Violence:   . Fear of Current or Ex-Partner:   . Emotionally Abused:   Marland Kitchen Physically Abused:   . Sexually Abused:     Family History  Problem Relation Age of Onset  . Heart disease Mother   . Cancer Father      Review of Systems  Constitutional:  Negative.  Negative for chills and fever.  HENT: Negative.  Negative for congestion and sore throat.   Respiratory: Negative.  Negative for cough and shortness of breath.   Cardiovascular: Negative.  Negative for chest pain and palpitations.  Gastrointestinal: Positive for abdominal pain, constipation and heartburn. Negative for blood in stool, diarrhea, nausea and vomiting.  Genitourinary: Negative.  Negative for dysuria and hematuria.  Musculoskeletal: Negative for myalgias and neck pain.  Skin: Negative.  Negative for rash.  Neurological: Negative for dizziness and headaches.  All other systems reviewed and are negative.  Vitals:   05/17/20 1347  BP: (!) 150/65  Pulse: 76  Temp: 98.2 F (36.8 C)  SpO2: 97%     Physical Exam Vitals reviewed.  Constitutional:      Appearance: She is well-developed.  HENT:     Head: Normocephalic.     Right Ear: Tympanic membrane, ear canal and external ear normal.     Left Ear: Tympanic membrane, ear canal and external ear normal.  Eyes:     Extraocular Movements: Extraocular movements intact.     Conjunctiva/sclera: Conjunctivae normal.     Pupils: Pupils are equal, round, and reactive to light.  Cardiovascular:     Rate and Rhythm: Normal rate and regular rhythm.     Pulses: Normal pulses.     Heart sounds: Normal heart sounds.  Pulmonary:     Effort: Pulmonary effort is normal.     Breath sounds: Normal breath sounds.  Abdominal:     General: There is no distension.     Palpations: Abdomen is soft. There is no mass.     Tenderness: There is no abdominal tenderness. There is no guarding or rebound.     Hernia: No hernia is present.  Musculoskeletal:        General: Normal range of motion.     Cervical back: Normal range of motion and neck supple.  Lymphadenopathy:     Cervical: No cervical adenopathy.  Skin:    General: Skin is warm and dry.     Capillary Refill: Capillary refill takes less than 2 seconds.  Neurological:      General: No focal deficit present.     Mental Status: She is alert and oriented to person, place, and time.  Psychiatric:        Mood and Affect: Mood normal.        Behavior: Behavior normal.    A total of 30 minutes was spent with the patient, greater than 50% of which was in counseling/coordination of care regarding multiple chronic medical problems, review of office visit notes from previous practitioners, review of all medications, review of most recent blood work results, diet and nutrition, treatment and prevention of constipation, prognosis and  need for follow-up.   ASSESSMENT & PLAN: Shantelle was seen today for transitions of care and abdominal pain.  Diagnoses and all orders for this visit:  Essential hypertension, benign -     Comprehensive metabolic panel  Dyslipidemia -     Lipid panel  Chronic constipation  Encounter to establish care    Patient Instructions    Take Metamucil fiber supplement daily. May use occasional Fleet enemas as needed.    If you have lab work done today you will be contacted with your lab results within the next 2 weeks.  If you have not heard from [place] then please contact us. The fastest way to get your results is to register for My Chart.   IF you received an x-ray today, you will receive an invoice from [facility]. Please contact [facility] at [phone] with questions or concerns regarding your invoice.   IF you received labwork today, you will receive an invoice from [facility]. Please contact [facility] at [phone] with questions or concerns regarding your invoice.   Our billing staff will not be able to assist you with questions regarding bills from these companies.  You will be contacted with the lab results as soon as they are available. The fastest way to get your results is to activate your My Chart account. Instructions are located on the last page of this paperwork. If you have not heard from [place] regarding  the results in 2 weeks, please contact this office.      Constipation, Adult Constipation is when a person:  Poops (has a bowel movement) fewer times in a week than normal.  Has a hard time pooping.  Has poop that is dry, hard, or bigger than normal. Follow these instructions at home: Eating and drinking   Eat foods that have a lot of fiber, such as: ? Fresh fruits and vegetables. ? Whole grains. ? Beans.  Eat less of foods that are high in fat, low in fiber, or overly processed, such as: ? [place] fries. ? Hamburgers. ? Cookies. ? Candy. ? Soda.  Drink enough fluid to keep your pee (urine) clear or pale yellow. General instructions  Exercise regularly or as told by your doctor.  Go to the restroom when you feel like you need to poop. Do not hold it in.  Take over-the-counter and prescription medicines only as told by your doctor. These include any fiber supplements.  Do pelvic floor retraining exercises, such as: ? Doing deep breathing while relaxing your lower belly (abdomen). ? Relaxing your pelvic floor while pooping.  Watch your condition for any changes.  Keep all follow-up visits as told by your doctor. This is important. Contact a doctor if:  You have pain that gets worse.  You have a fever.  You have not pooped for 4 days.  You throw up (vomit).  You are not hungry.  You lose weight.  You are bleeding from the anus.  You have thin, pencil-like poop (stool). Get help right away if:  You have a fever, and your symptoms suddenly get worse.  You leak poop or have blood in your poop.  Your belly feels hard or bigger than normal (is bloated).  You have very bad belly pain.  You feel dizzy or you faint. This information is not intended to replace advice given to you by your health care provider. Make sure you discuss any questions you have with your health care provider. Document Revised: 10/11/2017 Document Reviewed: 04/18/2016 Elsevier  Patient Education  [employer].      Agustina Caroli, MD [facility]

## 2020-05-17 NOTE — Addendum Note (Signed)
Addended by: Kittie Plater, Kearstin Learn HUA on: 05/17/2020 03:00 PM   Modules accepted: Orders

## 2020-05-17 NOTE — Addendum Note (Signed)
Addended by: Kittie Plater, Tacarra Justo HUA on: 05/17/2020 02:29 PM   Modules accepted: Orders

## 2020-05-18 LAB — COMPREHENSIVE METABOLIC PANEL
ALT: 7 IU/L (ref 0–32)
AST: 9 IU/L (ref 0–40)
Albumin/Globulin Ratio: 1.6 (ref 1.2–2.2)
Albumin: 4.6 g/dL (ref 3.6–4.6)
Alkaline Phosphatase: 64 IU/L (ref 48–121)
BUN/Creatinine Ratio: 23 (ref 12–28)
BUN: 26 mg/dL (ref 8–27)
Bilirubin Total: <0.2 mg/dL (ref 0.0–1.2)
CO2: 24 mmol/L (ref 20–29)
Calcium: 9.9 mg/dL (ref 8.7–10.3)
Chloride: 102 mmol/L (ref 96–106)
Creatinine, Ser: 1.14 mg/dL — ABNORMAL HIGH (ref 0.57–1.00)
GFR calc Af Amer: 52 mL/min/{1.73_m2} — ABNORMAL LOW (ref >59)
GFR calc non Af Amer: 45 mL/min/{1.73_m2} — ABNORMAL LOW (ref >59)
Globulin, Total: 2.9 g/dL (ref 1.5–4.5)
Glucose: 108 mg/dL — ABNORMAL HIGH (ref 65–99)
Potassium: 4.5 mmol/L (ref 3.5–5.2)
Sodium: 138 mmol/L (ref 134–144)
Total Protein: 7.5 g/dL (ref 6.0–8.5)

## 2020-05-18 LAB — LIPID PANEL
Chol/HDL Ratio: 4.2 ratio (ref 0.0–4.4)
Cholesterol, Total: 241 mg/dL — ABNORMAL HIGH (ref 100–199)
HDL: 57 mg/dL (ref >39)
LDL Chol Calc (NIH): 143 mg/dL — ABNORMAL HIGH (ref 0–99)
Triglycerides: 226 mg/dL — ABNORMAL HIGH (ref 0–149)
VLDL Cholesterol Cal: 41 mg/dL — ABNORMAL HIGH (ref 5–40)

## 2020-06-06 ENCOUNTER — Encounter: Payer: Medicare Other | Admitting: Registered Nurse

## 2020-07-11 ENCOUNTER — Other Ambulatory Visit: Payer: Self-pay | Admitting: Emergency Medicine

## 2020-07-11 MED ORDER — OMEPRAZOLE 40 MG PO CPDR: 40.0000 mg | DELAYED_RELEASE_CAPSULE | Freq: Every day | ORAL | 1 refills | Status: DC

## 2020-07-11 NOTE — Telephone Encounter (Signed)
Medication Refill - Medication: omeprazole (PRILOSEC) 40 MG capsule    Preferred Pharmacy (with phone number or street name):  [id number] [address], [place] Phone:  [phone]  Fax:  [phone]       Agent: Please be advised that RX refills may take up to 3 business days. We ask that you follow-up with your pharmacy.

## 2020-11-15 ENCOUNTER — Ambulatory Visit: Payer: Medicare Other | Admitting: Emergency Medicine

## 2021-01-03 ENCOUNTER — Other Ambulatory Visit: Payer: Self-pay | Admitting: Emergency Medicine

## 2021-01-03 NOTE — Telephone Encounter (Signed)
Patient needs an appointment - courtesy refill. Requested Prescriptions  Pending Prescriptions Disp Refills  . amLODipine (NORVASC) 10 MG tablet [Pharmacy Med Name: AMLODIPINE BESYLATE 10 MG TAB] 30 tablet 0    Sig: TAKE 1 TABLET BY MOUTH EVERY DAY     Cardiovascular:  Calcium Channel Blockers Failed - 01/03/2021  1:17 AM      Failed - Valid encounter within last 6 months    Recent Outpatient Visits          7 months ago Essential hypertension, benign   Primary Care at [facility], Ines Bloomer, MD   3 years ago Pain of left lower extremity   Primary Care at [facility], Gelene Mink, PA-C   3 years ago Dizziness and giddiness   Primary Care at [place], [place], MD   4 years ago Dizziness   Primary Care at [facility], Renette Butters, MD   6 years ago Knee pain, left   Primary [facility] at [facility], [facility]             Passed - Last BP in normal range    BP Readings from Last 1 Encounters:  05/17/20 132/60

## 2021-02-08 ENCOUNTER — Other Ambulatory Visit: Payer: Self-pay | Admitting: Emergency Medicine

## 2021-08-19 ENCOUNTER — Other Ambulatory Visit: Payer: Self-pay | Admitting: Emergency Medicine
